# Patient Record
Sex: Female | Born: 1975 | Race: White | Hispanic: No | Marital: Married | Smoking: Former smoker
Health system: Southern US, Community
[De-identification: ages and names within clinical notes are randomized; demographics above are authoritative.]

## PROBLEM LIST (undated history)

## (undated) ENCOUNTER — Inpatient Hospital Stay: Payer: Self-pay

## (undated) DIAGNOSIS — K219 Gastro-esophageal reflux disease without esophagitis: Secondary | ICD-10-CM

## (undated) DIAGNOSIS — J45909 Unspecified asthma, uncomplicated: Secondary | ICD-10-CM

## (undated) DIAGNOSIS — D649 Anemia, unspecified: Secondary | ICD-10-CM

## (undated) DIAGNOSIS — F419 Anxiety disorder, unspecified: Secondary | ICD-10-CM

## (undated) HISTORY — PX: TONSILLECTOMY: SUR1361

## (undated) HISTORY — DX: Unspecified asthma, uncomplicated: J45.909

---

## 2001-10-26 ENCOUNTER — Other Ambulatory Visit: Admission: RE | Admit: 2001-10-26 | Discharge: 2001-10-26 | Payer: Self-pay | Admitting: Family Medicine

## 2004-12-01 ENCOUNTER — Emergency Department: Payer: Self-pay | Admitting: Emergency Medicine

## 2004-12-02 ENCOUNTER — Ambulatory Visit: Payer: Self-pay | Admitting: Emergency Medicine

## 2005-08-14 ENCOUNTER — Ambulatory Visit: Payer: Self-pay | Admitting: Family Medicine

## 2005-08-25 ENCOUNTER — Ambulatory Visit: Payer: Self-pay | Admitting: Family Medicine

## 2011-10-04 ENCOUNTER — Ambulatory Visit: Payer: Self-pay | Admitting: Internal Medicine

## 2012-10-19 ENCOUNTER — Ambulatory Visit: Payer: Self-pay | Admitting: Family Medicine

## 2012-10-19 LAB — PREGNANCY, URINE: Pregnancy Test, Urine: NEGATIVE m[IU]/mL

## 2014-07-16 LAB — OB RESULTS CONSOLE HEPATITIS B SURFACE ANTIGEN: Hepatitis B Surface Ag: NEGATIVE

## 2014-07-16 LAB — OB RESULTS CONSOLE ANTIBODY SCREEN: Antibody Screen: NEGATIVE

## 2014-07-16 LAB — OB RESULTS CONSOLE VARICELLA ZOSTER ANTIBODY, IGG: Varicella: IMMUNE

## 2014-07-16 LAB — OB RESULTS CONSOLE RUBELLA ANTIBODY, IGM: Rubella: IMMUNE

## 2014-07-16 LAB — OB RESULTS CONSOLE ABO/RH: RH TYPE: POSITIVE

## 2014-07-16 LAB — OB RESULTS CONSOLE HIV ANTIBODY (ROUTINE TESTING): HIV: NONREACTIVE

## 2014-07-16 LAB — OB RESULTS CONSOLE GC/CHLAMYDIA
CHLAMYDIA, DNA PROBE: NEGATIVE
Gonorrhea: NEGATIVE

## 2014-07-16 LAB — OB RESULTS CONSOLE HGB/HCT, BLOOD
HCT: 38 %
Hemoglobin: 13.2 g/dL

## 2014-07-16 LAB — OB RESULTS CONSOLE RPR: RPR: NONREACTIVE

## 2014-07-16 LAB — OB RESULTS CONSOLE PLATELET COUNT: Platelets: 289 10*3/uL

## 2014-08-31 NOTE — L&D Delivery Note (Signed)
Delivery Note At  a viable female was delivered via  (Presentation OA: ;  ).  APGAR:8 ,9 ; weight  .   Placenta status: delivered- [place], . Pillow x1 wih 3 vessel Cord:  with the following complications:none .  Cord pH: NA  Anesthesia:  none Episiotomy:  none Lacerations:  2nd degree Suture Repair: 3.0 vicryl rapide Est. Blood Loss (mL):    Mom to postpartum.  Baby to Couplet care / Skin to Skin. Named Darlene Escobar   Darlene Escobar 03/05/2015, 8:55 PM

## 2015-01-30 ENCOUNTER — Encounter: Payer: Self-pay | Admitting: Obstetrics and Gynecology

## 2015-02-01 ENCOUNTER — Telehealth: Payer: Self-pay | Admitting: Obstetrics and Gynecology

## 2015-02-01 NOTE — Telephone Encounter (Signed)
ADVISED PT OK TO TAKE SOME SUDAFED OTC, SHE CAN USE ROBITUSSIN FOR THE COUGH, AND TO USE A HUMIDIFER TO HELP WITH CONGESTION, IF NOT ANY [facility] OFF[facility] FOR  APPT

## 2015-02-05 ENCOUNTER — Encounter: Payer: Self-pay | Admitting: *Deleted

## 2015-02-06 ENCOUNTER — Ambulatory Visit (INDEPENDENT_AMBULATORY_CARE_PROVIDER_SITE_OTHER): Payer: BC Managed Care – PPO | Admitting: Obstetrics and Gynecology

## 2015-02-06 VITALS — BP 120/78 | HR 94 | Wt 226.7 lb

## 2015-02-06 DIAGNOSIS — Z113 Encounter for screening for infections with a predominantly sexual mode of transmission: Secondary | ICD-10-CM

## 2015-02-06 DIAGNOSIS — Z36 Encounter for antenatal screening of mother: Secondary | ICD-10-CM

## 2015-02-06 DIAGNOSIS — Z3483 Encounter for supervision of other normal pregnancy, third trimester: Secondary | ICD-10-CM

## 2015-02-06 DIAGNOSIS — Z3685 Encounter for antenatal screening for Streptococcus B: Secondary | ICD-10-CM

## 2015-02-06 DIAGNOSIS — Z3493 Encounter for supervision of normal pregnancy, unspecified, third trimester: Secondary | ICD-10-CM

## 2015-02-06 LAB — POCT URINALYSIS DIPSTICK
Bilirubin, UA: NEGATIVE
GLUCOSE UA: NEGATIVE
KETONES UA: NEGATIVE
Leukocytes, UA: NEGATIVE
Nitrite, UA: NEGATIVE
RBC UA: NEGATIVE
SPEC GRAV UA: 1.01
Urobilinogen, UA: 0.2
pH, UA: 6

## 2015-02-06 NOTE — Progress Notes (Signed)
ROB- doing well, reports irregular BHC, Cultures obtained, labor precautions discussed; reviewed birth plan- scanned into chart

## 2015-02-06 NOTE — Patient Instructions (Signed)
Place 24-38 weeks prenatal visit patient instructions here.

## 2015-02-06 NOTE — Progress Notes (Signed)
Patient is c/o low back pain, increased pelvic pain

## 2015-02-07 ENCOUNTER — Encounter (HOSPITAL_COMMUNITY): Payer: Self-pay

## 2015-02-08 LAB — STREP GP B NAA: STREP GROUP B AG: POSITIVE — AB

## 2015-02-08 LAB — GC/CHLAMYDIA PROBE AMP
Chlamydia trachomatis, NAA: NEGATIVE
NEISSERIA GONORRHOEAE BY PCR: NEGATIVE

## 2015-02-13 ENCOUNTER — Ambulatory Visit (INDEPENDENT_AMBULATORY_CARE_PROVIDER_SITE_OTHER): Payer: BC Managed Care – PPO | Admitting: Obstetrics and Gynecology

## 2015-02-13 ENCOUNTER — Encounter: Payer: Self-pay | Admitting: Obstetrics and Gynecology

## 2015-02-13 VITALS — BP 110/78 | HR 93 | Wt 229.1 lb

## 2015-02-13 DIAGNOSIS — Z3493 Encounter for supervision of normal pregnancy, unspecified, third trimester: Secondary | ICD-10-CM

## 2015-02-13 LAB — POCT URINALYSIS DIPSTICK
Glucose, UA: NEGATIVE
Ketones, UA: NEGATIVE
LEUKOCYTES UA: NEGATIVE
Nitrite, UA: NEGATIVE
RBC UA: NEGATIVE
Spec Grav, UA: 1.015
UROBILINOGEN UA: 0.2
pH, UA: 6

## 2015-02-13 NOTE — Progress Notes (Signed)
ROB- doing well, reassured about swelling- to increase water intake daily and elevate feet when possible.

## 2015-02-13 NOTE — Progress Notes (Signed)
Pt is c/o contractions, low back pain, swelling in both feet

## 2015-02-14 ENCOUNTER — Observation Stay
Admission: EM | Admit: 2015-02-14 | Discharge: 2015-02-14 | Disposition: A | Discharge status: Home / Self Care | Payer: BC Managed Care – PPO | Attending: Obstetrics and Gynecology | Admitting: Obstetrics and Gynecology

## 2015-02-14 ENCOUNTER — Encounter: Payer: Self-pay | Admitting: *Deleted

## 2015-02-14 DIAGNOSIS — O368190 Decreased fetal movements, unspecified trimester, not applicable or unspecified: Secondary | ICD-10-CM | POA: Diagnosis present

## 2015-02-14 DIAGNOSIS — O368130 Decreased fetal movements, third trimester, not applicable or unspecified: Principal | ICD-10-CM | POA: Insufficient documentation

## 2015-02-14 DIAGNOSIS — O368131 Decreased fetal movements, third trimester, fetus 1: Secondary | ICD-10-CM

## 2015-02-14 NOTE — Discharge Instructions (Signed)
Drink plenty of water and get plenty of rest. Keep your next scheduled appointment, call your provider for any other concerns

## 2015-02-14 NOTE — OB Triage Note (Signed)
Recvd per Wheelchair c/o decreased fetal movement and contractions every 33  minutes since 1013   .  Changed to gown and to bed.  EFM applied.  Oriented to room and EFM applied.

## 2015-02-21 ENCOUNTER — Ambulatory Visit (INDEPENDENT_AMBULATORY_CARE_PROVIDER_SITE_OTHER): Payer: BC Managed Care – PPO | Admitting: Obstetrics and Gynecology

## 2015-02-21 VITALS — BP 110/73 | HR 90 | Wt 230.6 lb

## 2015-02-21 DIAGNOSIS — Z3493 Encounter for supervision of normal pregnancy, unspecified, third trimester: Secondary | ICD-10-CM

## 2015-02-21 LAB — POCT URINALYSIS DIPSTICK
BILIRUBIN UA: NEGATIVE
Blood, UA: NEGATIVE
Glucose, UA: NEGATIVE
KETONES UA: 15
Leukocytes, UA: NEGATIVE
Nitrite, UA: NEGATIVE
PH UA: 6
SPEC GRAV UA: 1.01
Urobilinogen, UA: 0.2

## 2015-02-21 NOTE — Progress Notes (Signed)
ROB- doing well, intermittent pelvic pressure but not new; labor precautions reiterated

## 2015-02-21 NOTE — Progress Notes (Signed)
Pt c/o pelvic pain, increased pressure

## 2015-02-27 ENCOUNTER — Ambulatory Visit (INDEPENDENT_AMBULATORY_CARE_PROVIDER_SITE_OTHER): Payer: BC Managed Care – PPO | Admitting: Obstetrics and Gynecology

## 2015-02-27 ENCOUNTER — Encounter: Payer: Self-pay | Admitting: Obstetrics and Gynecology

## 2015-02-27 VITALS — BP 124/84 | HR 108 | Wt 229.6 lb

## 2015-02-27 DIAGNOSIS — Z3493 Encounter for supervision of normal pregnancy, unspecified, third trimester: Secondary | ICD-10-CM

## 2015-02-27 LAB — POCT URINALYSIS DIPSTICK
Bilirubin, UA: NEGATIVE
KETONES UA: NEGATIVE
Leukocytes, UA: NEGATIVE
Nitrite, UA: NEGATIVE
RBC UA: NEGATIVE
Spec Grav, UA: 1.02
UROBILINOGEN UA: 0.2
pH, UA: 5

## 2015-02-27 NOTE — Progress Notes (Signed)
Pt is having some mild contractions, low back pain,pelvic pressure

## 2015-02-27 NOTE — Progress Notes (Signed)
ROB- doing well, denies any changes since las week; discussed routine postdate care and possible IOL on 7/11 if not delivered by then.

## 2015-03-05 ENCOUNTER — Other Ambulatory Visit: Payer: BC Managed Care – PPO

## 2015-03-05 ENCOUNTER — Inpatient Hospital Stay
Admission: EM | Admit: 2015-03-05 | Discharge: 2015-03-07 | DRG: 775 | Disposition: A | Discharge status: Home / Self Care | Payer: BC Managed Care – PPO | Attending: Obstetrics and Gynecology | Admitting: Obstetrics and Gynecology

## 2015-03-05 ENCOUNTER — Encounter: Payer: BC Managed Care – PPO | Admitting: Obstetrics and Gynecology

## 2015-03-05 DIAGNOSIS — Z79899 Other long term (current) drug therapy: Secondary | ICD-10-CM

## 2015-03-05 DIAGNOSIS — O4292 Full-term premature rupture of membranes, unspecified as to length of time between rupture and onset of labor: Principal | ICD-10-CM | POA: Diagnosis present

## 2015-03-05 DIAGNOSIS — Z3A40 40 weeks gestation of pregnancy: Secondary | ICD-10-CM | POA: Diagnosis present

## 2015-03-05 DIAGNOSIS — O99214 Obesity complicating childbirth: Secondary | ICD-10-CM | POA: Diagnosis present

## 2015-03-05 DIAGNOSIS — O99824 Streptococcus B carrier state complicating childbirth: Secondary | ICD-10-CM | POA: Diagnosis present

## 2015-03-05 DIAGNOSIS — E669 Obesity, unspecified: Secondary | ICD-10-CM | POA: Diagnosis present

## 2015-03-05 DIAGNOSIS — O09513 Supervision of elderly primigravida, third trimester: Secondary | ICD-10-CM

## 2015-03-05 DIAGNOSIS — O480 Post-term pregnancy: Secondary | ICD-10-CM | POA: Diagnosis present

## 2015-03-05 DIAGNOSIS — Z6841 Body Mass Index (BMI) 40.0 and over, adult: Secondary | ICD-10-CM | POA: Diagnosis not present

## 2015-03-05 DIAGNOSIS — Z3403 Encounter for supervision of normal first pregnancy, third trimester: Secondary | ICD-10-CM | POA: Diagnosis not present

## 2015-03-05 LAB — CBC
HEMATOCRIT: 32.9 % — AB (ref 35.0–47.0)
Hemoglobin: 11.6 g/dL — ABNORMAL LOW (ref 12.0–16.0)
MCH: 33.8 pg (ref 26.0–34.0)
MCHC: 35.1 g/dL (ref 32.0–36.0)
MCV: 96.1 fL (ref 80.0–100.0)
PLATELETS: 186 10*3/uL (ref 150–440)
RBC: 3.42 MIL/uL — ABNORMAL LOW (ref 3.80–5.20)
RDW: 13.4 % (ref 11.5–14.5)
WBC: 10.2 10*3/uL (ref 3.6–11.0)

## 2015-03-05 LAB — TYPE AND SCREEN
ABO/RH(D): A POS
ANTIBODY SCREEN: NEGATIVE

## 2015-03-05 LAB — ABO/RH: ABO/RH(D): A POS

## 2015-03-05 MED ORDER — FENTANYL CITRATE (PF) 100 MCG/2ML IJ SOLN
INTRAMUSCULAR | Status: AC
  Administered 2015-03-05: 50 ug via INTRAVENOUS
  Filled 2015-03-05: qty 2

## 2015-03-05 MED ORDER — LIDOCAINE HCL (PF) 1 % IJ SOLN
INTRAMUSCULAR | Status: AC
  Filled 2015-03-05: qty 30

## 2015-03-05 MED ORDER — SODIUM CHLORIDE 0.9 % IV SOLN
INTRAVENOUS | Status: AC
  Administered 2015-03-05: 1 g
  Filled 2015-03-05: qty 1000

## 2015-03-05 MED ORDER — ALBUTEROL SULFATE (2.5 MG/3ML) 0.083% IN NEBU: 3.0000 mL | INHALATION_SOLUTION | Freq: Four times a day (QID) | RESPIRATORY_TRACT | Status: DC | PRN

## 2015-03-05 MED ORDER — WITCH HAZEL-GLYCERIN EX PADS: 1.0000 "application " | MEDICATED_PAD | CUTANEOUS | Status: DC | PRN

## 2015-03-05 MED ORDER — CITRIC ACID-SODIUM CITRATE 334-500 MG/5ML PO SOLN: 30.0000 mL | ORAL | Status: DC | PRN

## 2015-03-05 MED ORDER — AMMONIA AROMATIC IN INHA
RESPIRATORY_TRACT | Status: AC
  Filled 2015-03-05: qty 10

## 2015-03-05 MED ORDER — SODIUM CHLORIDE 0.9 % IV SOLN
INTRAVENOUS | Status: AC
  Administered 2015-03-05: 2 g via INTRAVENOUS
  Filled 2015-03-05: qty 2000

## 2015-03-05 MED ORDER — PRENATAL MULTIVITAMIN CH
1.0000 | ORAL_TABLET | Freq: Every day | ORAL | Status: DC
  Administered 2015-03-06: 1 via ORAL
  Filled 2015-03-05: qty 1

## 2015-03-05 MED ORDER — IBUPROFEN 600 MG PO TABS
600.0000 mg | ORAL_TABLET | Freq: Four times a day (QID) | ORAL | Status: DC
  Administered 2015-03-05 – 2015-03-07 (×6): 600 mg via ORAL
  Filled 2015-03-05 (×6): qty 1

## 2015-03-05 MED ORDER — OXYTOCIN BOLUS FROM INFUSION
500.0000 mL | INTRAVENOUS | Status: DC
  Administered 2015-03-05: 500 mL via INTRAVENOUS

## 2015-03-05 MED ORDER — AMPICILLIN SODIUM 1 G IJ SOLR
INTRAMUSCULAR | Status: AC
  Administered 2015-03-05: 1 g
  Filled 2015-03-05: qty 1000

## 2015-03-05 MED ORDER — MISOPROSTOL 200 MCG PO TABS
ORAL_TABLET | ORAL | Status: AC
  Filled 2015-03-05: qty 4

## 2015-03-05 MED ORDER — ONDANSETRON HCL 4 MG/2ML IJ SOLN: 4.0000 mg | Freq: Four times a day (QID) | INTRAMUSCULAR | Status: DC | PRN

## 2015-03-05 MED ORDER — ACETAMINOPHEN 325 MG PO TABS: 650.0000 mg | ORAL_TABLET | ORAL | Status: DC | PRN

## 2015-03-05 MED ORDER — LACTATED RINGERS IV SOLN
INTRAVENOUS | Status: DC
  Administered 2015-03-05 (×2): via INTRAVENOUS

## 2015-03-05 MED ORDER — SODIUM CHLORIDE 0.9 % IV SOLN
INTRAVENOUS | Status: AC
  Administered 2015-03-05: 20:00:00
  Filled 2015-03-05: qty 1000

## 2015-03-05 MED ORDER — OXYCODONE-ACETAMINOPHEN 5-325 MG PO TABS
1.0000 | ORAL_TABLET | ORAL | Status: DC | PRN
  Administered 2015-03-05: 1 via ORAL

## 2015-03-05 MED ORDER — LIDOCAINE HCL (PF) 1 % IJ SOLN
30.0000 mL | INTRAMUSCULAR | Status: DC | PRN
  Filled 2015-03-05: qty 30

## 2015-03-05 MED ORDER — OXYTOCIN 40 UNITS IN LACTATED RINGERS INFUSION - SIMPLE MED
62.5000 mL/h | INTRAVENOUS | Status: DC
  Administered 2015-03-05: 62.5 mL/h via INTRAVENOUS

## 2015-03-05 MED ORDER — SENNOSIDES-DOCUSATE SODIUM 8.6-50 MG PO TABS
2.0000 | ORAL_TABLET | ORAL | Status: DC
  Administered 2015-03-05 – 2015-03-06 (×2): 2 via ORAL
  Filled 2015-03-05 (×2): qty 2

## 2015-03-05 MED ORDER — LANOLIN HYDROUS EX OINT: TOPICAL_OINTMENT | CUTANEOUS | Status: DC | PRN

## 2015-03-05 MED ORDER — ONDANSETRON HCL 4 MG/2ML IJ SOLN: 4.0000 mg | INTRAMUSCULAR | Status: DC | PRN

## 2015-03-05 MED ORDER — OXYTOCIN 10 UNIT/ML IJ SOLN
INTRAMUSCULAR | Status: AC
  Filled 2015-03-05: qty 2

## 2015-03-05 MED ORDER — TERBUTALINE SULFATE 1 MG/ML IJ SOLN
0.2500 mg | Freq: Once | INTRAMUSCULAR | Status: DC | PRN
  Filled 2015-03-05: qty 1

## 2015-03-05 MED ORDER — OXYCODONE-ACETAMINOPHEN 5-325 MG PO TABS: 1.0000 | ORAL_TABLET | ORAL | Status: DC | PRN

## 2015-03-05 MED ORDER — DIBUCAINE 1 % RE OINT
1.0000 | TOPICAL_OINTMENT | RECTAL | Status: DC | PRN
Start: 2015-03-05 — End: 2015-03-07

## 2015-03-05 MED ORDER — SODIUM CHLORIDE 0.9 % IJ SOLN
INTRAMUSCULAR | Status: AC
  Filled 2015-03-05: qty 50

## 2015-03-05 MED ORDER — LACTATED RINGERS IV SOLN: 500.0000 mL | INTRAVENOUS | Status: DC | PRN

## 2015-03-05 MED ORDER — BENZOCAINE-MENTHOL 20-0.5 % EX AERO: 1.0000 "application " | INHALATION_SPRAY | CUTANEOUS | Status: DC | PRN

## 2015-03-05 MED ORDER — FENTANYL CITRATE (PF) 100 MCG/2ML IJ SOLN
50.0000 ug | INTRAMUSCULAR | Status: DC | PRN
  Administered 2015-03-05 (×6): 50 ug via INTRAVENOUS

## 2015-03-05 MED ORDER — OXYCODONE-ACETAMINOPHEN 5-325 MG PO TABS: 2.0000 | ORAL_TABLET | ORAL | Status: DC | PRN

## 2015-03-05 MED ORDER — OXYTOCIN 40 UNITS IN LACTATED RINGERS INFUSION - SIMPLE MED: 62.5000 mL/h | INTRAVENOUS | Status: DC | PRN

## 2015-03-05 MED ORDER — ONDANSETRON HCL 4 MG PO TABS: 4.0000 mg | ORAL_TABLET | ORAL | Status: DC | PRN

## 2015-03-05 MED ORDER — OXYCODONE-ACETAMINOPHEN 5-325 MG PO TABS
ORAL_TABLET | ORAL | Status: AC
  Administered 2015-03-05: 1 via ORAL
  Filled 2015-03-05: qty 1

## 2015-03-05 MED ORDER — DIPHENHYDRAMINE HCL 25 MG PO CAPS: 25.0000 mg | ORAL_CAPSULE | Freq: Four times a day (QID) | ORAL | Status: DC | PRN

## 2015-03-05 MED ORDER — SODIUM CHLORIDE 0.9 % IV SOLN
2.0000 g | Freq: Once | INTRAVENOUS | Status: AC
  Administered 2015-03-05: 2 g via INTRAVENOUS

## 2015-03-05 MED ORDER — OXYTOCIN 40 UNITS IN LACTATED RINGERS INFUSION - SIMPLE MED
INTRAVENOUS | Status: AC
  Administered 2015-03-05: 1 m[IU]/min via INTRAVENOUS
  Filled 2015-03-05: qty 1000

## 2015-03-05 MED ORDER — SIMETHICONE 80 MG PO CHEW: 80.0000 mg | CHEWABLE_TABLET | ORAL | Status: DC | PRN

## 2015-03-05 MED ORDER — OXYTOCIN 40 UNITS IN LACTATED RINGERS INFUSION - SIMPLE MED
1.0000 m[IU]/min | INTRAVENOUS | Status: DC
  Administered 2015-03-05 (×2): 1 m[IU]/min via INTRAVENOUS

## 2015-03-05 NOTE — Progress Notes (Signed)
Darlene Escobar is a 39 y.o. G1P0 at 6426w4d by LMP admitted for rupture of membranes  Subjective:   Objective: BP 123/85 mmHg  Pulse 97  Temp(Src) 97.9 F (36.6 C) (Oral)  Resp 20  Ht 5\' 3"  (1.6 m)  Wt 103.874 kg (229 lb)  BMI 40.58 kg/m2  LMP 05/25/2014 (LMP Unknown)      FHT:  FHR: 150 bpm, variability: moderate,  accelerations:  Present,  decelerations:  Absent UC:   regular, every 4 minutes on 4 mu/min of pitocin SVE:   Dilation: 1.5 Effacement (%): 90 Station: -2 Exam by:: TFH  Labs: Lab Results  Component Value Date   WBC 10.2 03/05/2015   HGB 11.6* 03/05/2015   HCT 32.9* 03/05/2015   MCV 96.1 03/05/2015   PLT 186 03/05/2015    Assessment / Plan: Augmentation of labor, progressing well  Labor: Progressing on Pitocin, will continue to increase then AROM Preeclampsia:  na Fetal Wellbeing:  Category II Pain Control:  Labor support without medications I/D:  n/a Anticipated MOD:  NSVD  Darlene Escobar 03/05/2015, 12:09 PM

## 2015-03-05 NOTE — OB Triage Note (Signed)
Pt presents to L&D with c/o contractions and leaking fluid at 0100

## 2015-03-05 NOTE — Progress Notes (Signed)
Darlene Escobar is a 39 y.o. G1P0 at 5623w4d by LMP admitted for rupture of membranes  Subjective:   Objective: BP 131/83 mmHg  Pulse 93  Temp(Src) 97.9 F (36.6 C) (Oral)  Resp 20  Ht 5\' 3"  (1.6 m)  Wt 103.874 kg (229 lb)  BMI 40.58 kg/m2  LMP 05/25/2014 (LMP Unknown)      Fetal Wellbeing:  Category II UC:   irregular, every 5-7 minutes SVE: 1.5/80/-2 with scant spotting Labs: Lab Results  Component Value Date   WBC 10.2 03/05/2015   HGB 11.6* 03/05/2015   HCT 32.9* 03/05/2015   MCV 96.1 03/05/2015   PLT 186 03/05/2015    Assessment / Plan: Augmentation of labor, progressing well  Labor: pitocin to be retarted now Preeclampsia:  labs stable Pain Control:  Labor support without medications Anticipated MOD:  NSVD  Darlene Escobar 03/05/2015, 7:55 AM

## 2015-03-05 NOTE — H&P (Signed)
Obstetric History and Physical  Darlene Escobar is a 39 y.o. G1P0 with IUP at 743w4d presenting with leaking of fluid- reports large gush of fluid that soaked the sheets, woke her up at 0100 this am. Patient states she has been having  irregular, every 15 minutes contractions, none vaginal bleeding, intact, clear fluid, NTZ ? With clear mucus like fluid noted membranes, with active fetal movement.    [facility]  Pregnancy complications or risks:obesity  Prenatal labs and studies: ABO, Rh: --/--/A POS (07/05 0445) Antibody: NEG (07/05 0445) Rubella: Immune (11/16 0000) RPR: Nonreactive (11/16 0000)  HBsAg: Negative (11/16 0000)  HIV: Non-reactive (11/16 0000)  GBS:  1 hr Glucola  normal Genetic screening normal Anatomy US normal  History reviewed. No pertinent past medical history.  Past Surgical History  Procedure Laterality Date  . Tonsillectomy      OB History  Gravida Para Term Preterm AB SAB TAB Ectopic Multiple Living  1             # Outcome Date GA Lbr Len/2nd Weight Sex Delivery Anes PTL Lv  1 Current               History   Social History  . Marital Status: Married    Spouse Name: N/A  . Number of Children: N/A  . Years of Education: N/A   Social History Main Topics  . Smoking status: Never Smoker   . Smokeless tobacco: Never Used  . Alcohol Use: No  . Drug Use: No  . Sexual Activity: Yes    Birth Control/ Protection: None   Other Topics Concern  . None   Social History Narrative    Family History  Problem Relation Age of Onset  . Diabetes Mother   . Heart disease Father     Prescriptions prior to admission  Medication Sig Dispense Refill Last Dose  . albuterol (PROVENTIL HFA;VENTOLIN HFA) 108 (90 BASE) MCG/ACT inhaler Inhale 1 puff into the lungs every 6 (six) hours as needed for wheezing or shortness of breath.   Taking  . Prenatal Vit-Fe Fumarate-FA (PRENATAL MULTIVITAMIN) TABS tablet Take 1 tablet by mouth daily  at 12 noon.   Taking    No Known Allergies  Review of Systems: Negative except for what is mentioned in HPI.  Physical Exam: BP 131/88 mmHg  Pulse 98  Temp(Src) 97.9 F (36.6 C) (Oral)  Resp 20  Ht 5\' 3"  (1.6 m)  Wt 103.874 kg (229 lb)  BMI 40.58 kg/m2  LMP 05/25/2014 (LMP Unknown) GENERAL: Well-developed, well-nourished female in no acute distress.  LUNGS: Clear to auscultation bilaterally.  HEART: Regular rate and rhythm. ABDOMEN: Soft, nontender, nondistended, gravid. EXTREMITIES: Nontender, no edema, 2+ distal pulses. Cervical Exam: Dilation: Fingertip Effacement (%): 40 Cervical Position: Posterior Station: -3 Presentation: Undeterminable Exam by:: sca FHT:  Baseline rate 135 bpm   Variability moderate  Accelerations present   Decelerations none Contractions: Every 8 mins   Pertinent Labs/Studies:   Results for orders placed or performed during the hospital encounter of 03/05/15 (from the past 24 hour(s))  CBC     Status: Abnormal   Collection Time: 03/05/15  4:45 AM  Result Value Ref Range   WBC 10.2 3.6 - 11.0 K/uL   RBC 3.42 (L) 3.80 - 5.20 MIL/uL   Hemoglobin 11.6 (L) 12.0 - 16.0 g/dL   HCT 69.632.9 (L) 29.535.0 - 28.447.0 %   MCV 96.1 80.0 - 100.0 fL   MCH 33.8  26.0 - 34.0 pg   MCHC 35.1 32.0 - 36.0 g/dL   RDW 16.1 09.6 - 04.5 %   Platelets 186 150 - 440 K/uL  Type and screen     Status: None   Collection Time: 03/05/15  4:45 AM  Result Value Ref Range   ABO/RH(D) A POS    Antibody Screen NEG    Sample Expiration 03/08/2015     Assessment : Darlene Escobar is a 39 y.o. G1P0 at [redacted]w[redacted]d being admitted for labor, with possible SROM.  Plan: Labor:SROM-  Induction/Augmentation as needed, per protocol FWB: Reassuring fetal heart tracing.  GBS positive Delivery plan: Hopeful for vaginal delivery  Melody Ines Bloomer, CNM [facility], [facility]

## 2015-03-06 LAB — CBC
HCT: 28.9 % — ABNORMAL LOW (ref 35.0–47.0)
Hemoglobin: 9.7 g/dL — ABNORMAL LOW (ref 12.0–16.0)
MCH: 32.4 pg (ref 26.0–34.0)
MCHC: 33.8 g/dL (ref 32.0–36.0)
MCV: 95.9 fL (ref 80.0–100.0)
Platelets: 183 10*3/uL (ref 150–440)
RBC: 3.01 MIL/uL — ABNORMAL LOW (ref 3.80–5.20)
RDW: 13.7 % (ref 11.5–14.5)
WBC: 19.4 10*3/uL — ABNORMAL HIGH (ref 3.6–11.0)

## 2015-03-06 LAB — RPR: RPR Ser Ql: NONREACTIVE

## 2015-03-06 NOTE — Progress Notes (Signed)
Post Partum Day 1 Subjective: no complaints, up ad lib and voiding  Objective: Blood pressure 109/59, pulse 88, temperature 98.9 F (37.2 C), temperature source Oral, resp. rate 20, height 5\' 3"  (1.6 m), weight 103.874 kg (229 lb), last menstrual period 05/25/2014, SpO2 98 %, unknown if currently breastfeeding.  Physical Exam:  General: alert and cooperative Lochia: appropriate Uterine Fundus: firm Incision: NA DVT Evaluation: No evidence of DVT seen on physical exam. Negative Homan's sign.   Recent Labs  03/05/15 0445 03/06/15 0604  HGB 11.6* 9.7*  HCT 32.9* 28.9*    Assessment/Plan: Plan for discharge tomorrow and Breastfeeding   LOS: 1 day   Rashied Corallo Burr 03/06/2015, 3:52 PM

## 2015-03-07 MED ORDER — VITAMIN D 50 MCG (2000 UT) PO CAPS: 1.0000 | ORAL_CAPSULE | ORAL | Status: DC

## 2015-03-07 NOTE — Discharge Instructions (Signed)
Please call your doctor or return to the ER if you experience any chest pains, shortness of breath, fever greater than 101, any heavy bleeding or large clots, and foul smelling vaginal discharge, any worsening abdominal pain & cramping that is not controlled by pain medication, or any signs of post partum depression.  No tampons, enemas, douches, or sexual intercourse for 6 weeks.  Also avoid tub baths, hot tubs, or swimming for 6 weeks.    

## 2015-03-07 NOTE — Progress Notes (Signed)
D/C order from MD.  Reviewed d/c instructions and prescriptions with patient and answered any questions.  Patient d/c home with infant via wheelchair by nursing/auxillary. 

## 2015-03-07 NOTE — Discharge Summary (Signed)
Obstetric Discharge Summary Reason for Admission: onset of labor Prenatal Procedures: ultrasound Intrapartum Procedures: spontaneous vaginal delivery Postpartum Procedures: none Complications-Operative and Postpartum: 2nd degree perineal laceration HEMOGLOBIN  Date Value Ref Range Status  03/06/2015 9.7* 12.0 - 16.0 g/dL Final  96/04/540911/16/2015 81.113.2 g/dL Final   HCT  Date Value Ref Range Status  03/06/2015 28.9* 35.0 - 47.0 % Final  07/16/2014 38 % Final    Physical Exam:  General: alert, cooperative, fatigued and pale Lochia: appropriate Uterine Fundus: firm Incision: NA DVT Evaluation: No evidence of DVT seen on physical exam. Negative Homan's sign.  Discharge Diagnoses: Term Pregnancy-delivered  Discharge Information: Date: 03/07/2015 Activity: pelvic rest Diet: routine Medications: PNV, Ibuprofen, Colace and Iron Condition: stable Instructions: refer to practice specific booklet Discharge to: home   Newborn Data: Live born female  Birth Weight: 8 lb 8.5 oz (3870 g) APGAR: 8, 9  Home with mother, plans circumcision post discharge.  Melody Burr 03/07/2015, 8:22 AM

## 2015-03-08 ENCOUNTER — Other Ambulatory Visit: Payer: BC Managed Care – PPO

## 2015-04-16 ENCOUNTER — Ambulatory Visit (INDEPENDENT_AMBULATORY_CARE_PROVIDER_SITE_OTHER): Payer: BC Managed Care – PPO | Admitting: Obstetrics and Gynecology

## 2015-04-16 ENCOUNTER — Encounter: Payer: Self-pay | Admitting: Obstetrics and Gynecology

## 2015-04-16 DIAGNOSIS — L259 Unspecified contact dermatitis, unspecified cause: Secondary | ICD-10-CM

## 2015-04-16 MED ORDER — CLOBETASOL PROPIONATE 0.05 % EX OINT: 1.0000 "application " | TOPICAL_OINTMENT | Freq: Two times a day (BID) | CUTANEOUS | Status: DC

## 2015-04-16 NOTE — Patient Instructions (Signed)
  Place postpartum visit patient instructions here.  

## 2015-04-16 NOTE — Progress Notes (Signed)
  Subjective:     Darlene Escobar is a 39 y.o. female who presents for a postpartum visit. She is 6 weeks postpartum following a spontaneous vaginal delivery. I have fully reviewed the prenatal and intrapartum course. The delivery was at 39 gestational weeks. Outcome: spontaneous vaginal delivery. Anesthesia: epidural. Postpartum course has been uneventful. Baby's course has been uneventful. Baby is feeding by breast. Bleeding thin lochia. Bowel function is normal. Bladder function is normal. Patient is not sexually active. Contraception method is abstinence. Postpartum depression screening: negative.  The following portions of the patient's history were reviewed and updated as appropriate: allergies, current medications, past family history, past medical history, past social history, past surgical history and problem list.  Review of Systems A comprehensive review of systems was negative except for: skin rash x 1 week in groin bilaterally and on gluteal cheeks   Objective:    BP 117/82 mmHg  Pulse 81  Ht  (1.6 m)  Wt 208 lb 8 oz (94.575 kg)  BMI 36.94 kg/m2  LMP 04/14/2015  Breastfeeding? Yes  General:  alert, cooperative, appears stated age and mildly obese   Breasts:  inspection negative, no nipple discharge or bleeding, no masses or nodularity palpable  Lungs: clear to auscultation bilaterally  Heart:  regular rate and rhythm, S1, S2 normal, no murmur, click, rub or gallop  Abdomen: soft, non-tender; bowel sounds normal; no masses,  no organomegaly   Vulva:  normal  Vagina: normal vagina  Cervix:  multiparous appearance and dark red rubra  Corpus: normal size, contour, position, consistency, mobility, non-tender  Adnexa:  normal adnexa  Rectal Exam: Not performed.       Groin with contact dermatitis- also noted on both cheeks Assessment:     6 weeks postpartum exam. Pap smear not done at today's visit.   Plan:    1. Contraception: condoms 2. Contact dermatitis- rx for  clobetasol sent in 3. Follow up in: 4 months or as needed.

## 2015-05-01 ENCOUNTER — Other Ambulatory Visit: Payer: Self-pay | Admitting: Obstetrics and Gynecology

## 2015-05-01 ENCOUNTER — Telehealth: Payer: Self-pay | Admitting: Obstetrics and Gynecology

## 2015-05-01 DIAGNOSIS — R21 Rash and other nonspecific skin eruption: Secondary | ICD-10-CM

## 2015-05-01 NOTE — Telephone Encounter (Signed)
Please let her know I am not sure what else to prescrib, and see if she has had appt with dermatologist as I suggested? If not I want her to see the dermatologist for this, and can take benedryl prn until then.

## 2015-05-01 NOTE — Telephone Encounter (Signed)
Please let her know when referral sent in

## 2015-05-01 NOTE — Telephone Encounter (Signed)
Melody please put referral in for derm, pt voiced understanding

## 2015-05-01 NOTE — Telephone Encounter (Signed)
Darlene Escobar came in a couple wks ago and you gave her something for a rash. It is worse no and asked could she get something different for it. Very itchy and just worse.

## 2015-08-16 ENCOUNTER — Other Ambulatory Visit: Payer: Self-pay | Admitting: Obstetrics and Gynecology

## 2015-08-16 ENCOUNTER — Ambulatory Visit (INDEPENDENT_AMBULATORY_CARE_PROVIDER_SITE_OTHER): Payer: BC Managed Care – PPO | Admitting: Obstetrics and Gynecology

## 2015-08-16 ENCOUNTER — Encounter: Payer: Self-pay | Admitting: Obstetrics and Gynecology

## 2015-08-16 VITALS — BP 107/73 | HR 83 | Ht 63.0 in | Wt 215.5 lb

## 2015-08-16 DIAGNOSIS — Z01419 Encounter for gynecological examination (general) (routine) without abnormal findings: Secondary | ICD-10-CM

## 2015-08-16 NOTE — Progress Notes (Signed)
Subjective:   Darlene Escobar is a 39 y.o. 411P1001 Caucasian female here for a routine well-woman exam.  No LMP recorded.    Current complaints: right nipple tenderness x 3 days after infant bite down on it. PCP: none       Doesn't need or desire labs  Social History: Sexual: heterosexual Marital Status: married Living situation: with spouse Occupation: homemaker Tobacco/alcohol: no tobacco use Illicit drugs: no history of illicit drug use  The following portions of the patient's history were reviewed and updated as appropriate: allergies, current medications, past family history, past medical history, past social history, past surgical history and problem list.  Past Medical History History reviewed. No pertinent past medical history.  Past Surgical History Past Surgical History  Procedure Laterality Date  . Tonsillectomy      Gynecologic History G1P1001  No LMP recorded. Contraception: coitus interruptus Last Pap: 2014. Results were: normal  Obstetric History OB History  Gravida Para Term Preterm AB SAB TAB Ectopic Multiple Living  1 1 1       0 1    # Outcome Date GA Lbr Len/2nd Weight Sex Delivery Anes PTL Lv  1 Term 03/05/15 6560w4d / 00:17 8 lb 8.5 oz (3.87 kg) M Vag-Spont None  Y      Current Medications Current Outpatient Prescriptions on File Prior to Visit  Medication Sig Dispense Refill  . albuterol (PROVENTIL HFA;VENTOLIN HFA) 108 (90 BASE) MCG/ACT inhaler Inhale 1 puff into the lungs every 6 (six) hours as needed for wheezing or shortness of breath.    . Cholecalciferol (VITAMIN D) 2000 UNITS CAPS Take 1 capsule (2,000 Units total) by mouth as directed. 90 capsule 2  . Prenatal Vit-Fe Fumarate-FA (PRENATAL MULTIVITAMIN) TABS tablet Take 1 tablet by mouth daily.     . clobetasol ointment (TEMOVATE) 0.05 % Apply 1 application topically 2 (two) times daily. Apply to affected area (Patient not taking: Reported on 08/16/2015) 30 g 2   No current  facility-administered medications on file prior to visit.    Review of Systems Patient denies any headaches, blurred vision, shortness of breath, chest pain, abdominal pain, problems with bowel movements, urination, or intercourse.  Objective:  BP 107/73 mmHg  Pulse 83  Ht 5\' 3"  (1.6 m)  Wt 215 lb 8 oz (97.75 kg)  BMI 38.18 kg/m2  Breastfeeding? No Physical Exam  General:  Well developed, well nourished, no acute distress. She is alert and oriented x3. Skin:  Warm and dry Neck:  Midline trachea, no thyromegaly or nodules Cardiovascular: Regular rate and rhythm, no murmur heard Lungs:  Effort normal, all lung fields clear to auscultation bilaterally Breasts:  No dominant palpable mass, retraction, or nipple discharge Abdomen:  Soft, non tender, no hepatosplenomegaly or masses Pelvic:  External genitalia is normal in appearance.  The vagina is normal in appearance. The cervix is bulbous, no CMT.  Thin prep pap is done with HR HPV cotesting. Uterus is felt to be normal size, shape, and contour.  No adnexal masses or tenderness noted. Extremities:  No swelling or varicosities noted Psych:  She has a normal mood and affect  Assessment:   Healthy well-woman exam Obesity Breast feeding  Plan:  Pap obtained F/U 1 year for AE, or sooner if needed  Melody Suzan NailerN Shambley, CNM

## 2015-08-16 NOTE — Patient Instructions (Signed)
  Place annual gynecologic exam patient instructions here.  Thank you for enrolling in MyChart. Please follow the instructions below to securely access your online medical record. MyChart allows you to send messages to your doctor, view your test results, manage appointments, and more.   How Do I Sign Up? 1. In your Internet browser, go to [employer] and enter [email or web address]. 2. Click on the Sign Up Now link in the Sign In box. You will see the New Member Sign Up page. 3. Enter your MyChart Access Code exactly as it appears below. You will not need to use this code after you've completed the sign-up process. If you do not sign up before the expiration date, you must request a new code.  MyChart Access Code: [email or web address] Expires: 10/15/2015  9:46 AM  4. Enter your Social Security Number ([id number]) and Date of Birth (mm/dd/yyyy) as indicated and click Submit. You will be taken to the next sign-up page. 5. Create a MyChart ID. This will be your MyChart login ID and cannot be changed, so think of one that is secure and easy to remember. 6. Create a MyChart password. You can change your password at any time. 7. Enter your Password Reset Question and Answer. This can be used at a later time if you forget your password.  8. Enter your e-mail address. You will receive e-mail notification when new information is available in MyChart. 9. Click Sign Up. You can now view your medical record.   Additional Information Remember, MyChart is NOT to be used for urgent needs. For medical emergencies, dial 911.

## 2015-08-20 LAB — CYTOLOGY - PAP

## 2015-08-21 ENCOUNTER — Other Ambulatory Visit: Payer: Self-pay | Admitting: Obstetrics and Gynecology

## 2015-08-21 DIAGNOSIS — IMO0002 Reserved for concepts with insufficient information to code with codable children: Secondary | ICD-10-CM | POA: Insufficient documentation

## 2015-08-30 ENCOUNTER — Encounter: Payer: Self-pay | Admitting: Emergency Medicine

## 2015-08-30 ENCOUNTER — Ambulatory Visit (INDEPENDENT_AMBULATORY_CARE_PROVIDER_SITE_OTHER): Payer: BC Managed Care – PPO

## 2015-08-30 ENCOUNTER — Ambulatory Visit
Admission: EM | Admit: 2015-08-30 | Discharge: 2015-08-30 | Disposition: A | Discharge status: Home / Self Care | Payer: BC Managed Care – PPO | Attending: Family Medicine | Admitting: Family Medicine

## 2015-08-30 DIAGNOSIS — S161XXA Strain of muscle, fascia and tendon at neck level, initial encounter: Secondary | ICD-10-CM

## 2015-08-30 DIAGNOSIS — S29009A Unspecified injury of muscle and tendon of unspecified wall of thorax, initial encounter: Secondary | ICD-10-CM | POA: Diagnosis not present

## 2015-08-30 DIAGNOSIS — S29019A Strain of muscle and tendon of unspecified wall of thorax, initial encounter: Secondary | ICD-10-CM

## 2015-08-30 LAB — PREGNANCY, URINE: PREG TEST UR: NEGATIVE

## 2015-08-30 MED ORDER — IBUPROFEN 800 MG PO TABS: 800.0000 mg | ORAL_TABLET | Freq: Three times a day (TID) | ORAL | Status: AC | PRN

## 2015-08-30 MED ORDER — KETOROLAC TROMETHAMINE 10 MG PO TABS: 10.0000 mg | ORAL_TABLET | Freq: Four times a day (QID) | ORAL | Status: DC | PRN

## 2015-08-30 MED ORDER — KETOROLAC TROMETHAMINE 10 MG PO TABS: 10.0000 mg | ORAL_TABLET | Freq: Four times a day (QID) | ORAL | Status: AC | PRN

## 2015-08-30 MED ORDER — ACETAMINOPHEN 500 MG PO TABS: 1000.0000 mg | ORAL_TABLET | Freq: Four times a day (QID) | ORAL | Status: AC | PRN
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
## 2015-08-30 NOTE — Discharge Instructions (Signed)
°Cervical Strain and Sprain With Rehab °Cervical strain and sprain are injuries that commonly occur with "whiplash" injuries. Whiplash occurs when the neck is forcefully whipped backward or forward, such as during a motor vehicle accident or during contact sports. The muscles, ligaments, tendons, discs, and nerves of the neck are susceptible to injury when this occurs. °RISK FACTORS °Risk of having a whiplash injury increases if: °· Osteoarthritis of the spine. °· Situations that make head or neck accidents or trauma more likely. °· High-risk sports (football, rugby, wrestling, hockey, auto racing, gymnastics, diving, contact karate, or boxing). °· Poor strength and flexibility of the neck. °· Previous neck injury. °· Poor tackling technique. °· Improperly fitted or padded equipment. °SYMPTOMS  °· Pain or stiffness in the front or back of neck or both. °· Symptoms may present immediately or up to 24 hours after injury. °· Dizziness, headache, nausea, and vomiting. °· Muscle spasm with soreness and stiffness in the neck. °· Tenderness and swelling at the injury site. °PREVENTION °· Learn and use proper technique (avoid tackling with the head, spearing, and head-butting; use proper falling techniques to avoid landing on the head). °· Warm up and stretch properly before activity. °· Maintain physical fitness: °¨ Strength, flexibility, and endurance. °¨ Cardiovascular fitness. °· Wear properly fitted and padded protective equipment, such as padded soft collars, for participation in contact sports. °PROGNOSIS  °Recovery from cervical strain and sprain injuries is dependent on the extent of the injury. These injuries are usually curable in 1 week to 3 months with appropriate treatment.  °RELATED COMPLICATIONS  °· Temporary numbness and weakness may occur if the nerve roots are damaged, and this may persist until the nerve has completely healed. °· Chronic pain due to frequent recurrence of symptoms. °· Prolonged healing,  especially if activity is resumed too soon (before complete recovery). °TREATMENT  °Treatment initially involves the use of ice and medication to help reduce pain and inflammation. It is also important to perform strengthening and stretching exercises and modify activities that worsen symptoms so the injury does not get worse. These exercises may be performed at home or with a therapist. For patients who experience severe symptoms, a soft, padded collar may be recommended to be worn around the neck.  °Improving your posture may help reduce symptoms. Posture improvement includes pulling your chin and abdomen in while sitting or standing. If you are sitting, sit in a firm chair with your buttocks against the back of the chair. While sleeping, try replacing your pillow with a small towel rolled to 2 inches in diameter, or use a cervical pillow or soft cervical collar. Poor sleeping positions delay healing.  °For patients with nerve root damage, which causes numbness or weakness, the use of a cervical traction apparatus may be recommended. Surgery is rarely necessary for these injuries. However, cervical strain and sprains that are present at birth (congenital) may require surgery. °MEDICATION  °· If pain medication is necessary, nonsteroidal anti-inflammatory medications, such as aspirin and ibuprofen, or other minor pain relievers, such as acetaminophen, are often recommended. °· Do not take pain medication for 7 days before surgery. °· Prescription pain relievers may be given if deemed necessary by your caregiver. Use only as directed and only as much as you need. °HEAT AND COLD:  °· Cold treatment (icing) relieves pain and reduces inflammation. Cold treatment should be applied for 10 to 15 minutes every 2 to 3 hours for inflammation and pain and immediately after any activity that aggravates your   symptoms. Use ice packs or an ice massage.  Heat treatment may be used prior to performing the stretching and  strengthening activities prescribed by your caregiver, physical therapist, or athletic trainer. Use a heat pack or a warm soak. SEEK MEDICAL CARE IF:  1. Symptoms get worse or do not improve in 2 weeks despite treatment. 2. New, unexplained symptoms develop (drugs used in treatment may produce side effects). EXERCISES RANGE OF MOTION (ROM) AND STRETCHING EXERCISES - Cervical Strain and Sprain These exercises may help you when beginning to rehabilitate your injury. In order to successfully resolve your symptoms, you must improve your posture. These exercises are designed to help reduce the forward-head and rounded-shoulder posture which contributes to this condition. Your symptoms may resolve with or without further involvement from your physician, physical therapist or athletic trainer. While completing these exercises, remember:   Restoring tissue flexibility helps normal motion to return to the joints. This allows healthier, less painful movement and activity.  An effective stretch should be held for at least 20 seconds, although you may need to begin with shorter hold times for comfort.  A stretch should never be painful. You should only feel a gentle lengthening or release in the stretched tissue. STRETCH- Axial Extensors  Lie on your back on the floor. You may bend your knees for comfort. Place a rolled-up hand towel or dish towel, about 2 inches in diameter, under the part of your head that makes contact with the floor.  Gently tuck your chin, as if trying to make a "double chin," until you feel a gentle stretch at the base of your head.  Hold __________ seconds. Repeat __________ times. Complete this exercise __________ times per day.  STRETCH - Axial Extension   Stand or sit on a firm surface. Assume a good posture: chest up, shoulders drawn back, abdominal muscles slightly tense, knees unlocked (if standing) and feet hip width apart.  Slowly retract your chin so your head slides  back and your chin slightly lowers. Continue to look straight ahead.  You should feel a gentle stretch in the back of your head. Be certain not to feel an aggressive stretch since this can cause headaches later.  Hold for __________ seconds. Repeat __________ times. Complete this exercise __________ times per day. STRETCH - Cervical Side Bend   Stand or sit on a firm surface. Assume a good posture: chest up, shoulders drawn back, abdominal muscles slightly tense, knees unlocked (if standing) and feet hip width apart.  Without letting your nose or shoulders move, slowly tip your right / left ear to your shoulder until your feel a gentle stretch in the muscles on the opposite side of your neck.  Hold __________ seconds. Repeat __________ times. Complete this exercise __________ times per day. STRETCH - Cervical Rotators   Stand or sit on a firm surface. Assume a good posture: chest up, shoulders drawn back, abdominal muscles slightly tense, knees unlocked (if standing) and feet hip width apart.  Keeping your eyes level with the ground, slowly turn your head until you feel a gentle stretch along the back and opposite side of your neck.  Hold __________ seconds. Repeat __________ times. Complete this exercise __________ times per day. RANGE OF MOTION - Neck Circles   Stand or sit on a firm surface. Assume a good posture: chest up, shoulders drawn back, abdominal muscles slightly tense, knees unlocked (if standing) and feet hip width apart.  Gently roll your head down and around from the back  of one shoulder to the back of the other. The motion should never be forced or painful.  Repeat the motion 10-20 times, or until you feel the neck muscles relax and loosen. Repeat __________ times. Complete the exercise __________ times per day. STRENGTHENING EXERCISES - Cervical Strain and Sprain These exercises may help you when beginning to rehabilitate your injury. They may resolve your symptoms  with or without further involvement from your physician, physical therapist, or athletic trainer. While completing these exercises, remember:   Muscles can gain both the endurance and the strength needed for everyday activities through controlled exercises.  Complete these exercises as instructed by your physician, physical therapist, or athletic trainer. Progress the resistance and repetitions only as guided.  You may experience muscle soreness or fatigue, but the pain or discomfort you are trying to eliminate should never worsen during these exercises. If this pain does worsen, stop and make certain you are following the directions exactly. If the pain is still present after adjustments, discontinue the exercise until you can discuss the trouble with your clinician. STRENGTH - Cervical Flexors, Isometric  Face a wall, standing about 6 inches away. Place a small pillow, a ball about 6-8 inches in diameter, or a folded towel between your forehead and the wall.  Slightly tuck your chin and gently push your forehead into the soft object. Push only with mild to moderate intensity, building up tension gradually. Keep your jaw and forehead relaxed.  Hold 10 to 20 seconds. Keep your breathing relaxed.  Release the tension slowly. Relax your neck muscles completely before you start the next repetition. Repeat __________ times. Complete this exercise __________ times per day. STRENGTH- Cervical Lateral Flexors, Isometric   Stand about 6 inches away from a wall. Place a small pillow, a ball about 6-8 inches in diameter, or a folded towel between the side of your head and the wall.  Slightly tuck your chin and gently tilt your head into the soft object. Push only with mild to moderate intensity, building up tension gradually. Keep your jaw and forehead relaxed.  Hold 10 to 20 seconds. Keep your breathing relaxed.  Release the tension slowly. Relax your neck muscles completely before you start the next  repetition. Repeat __________ times. Complete this exercise __________ times per day. STRENGTH - Cervical Extensors, Isometric   Stand about 6 inches away from a wall. Place a small pillow, a ball about 6-8 inches in diameter, or a folded towel between the back of your head and the wall.  Slightly tuck your chin and gently tilt your head back into the soft object. Push only with mild to moderate intensity, building up tension gradually. Keep your jaw and forehead relaxed.  Hold 10 to 20 seconds. Keep your breathing relaxed.  Release the tension slowly. Relax your neck muscles completely before you start the next repetition. Repeat __________ times. Complete this exercise __________ times per day. POSTURE AND BODY MECHANICS CONSIDERATIONS - Cervical Strain and Sprain Keeping correct posture when sitting, standing or completing your activities will reduce the stress put on different body tissues, allowing injured tissues a chance to heal and limiting painful experiences. The following are general guidelines for improved posture. Your physician or physical therapist will provide you with any instructions specific to your needs. While reading these guidelines, remember:  The exercises prescribed by your provider will help you have the flexibility and strength to maintain correct postures.  The correct posture provides the optimal environment for your joints to work.  All of your joints have less wear and tear when properly supported by a spine with good posture. This means you will experience a healthier, less painful body.  Correct posture must be practiced with all of your activities, especially prolonged sitting and standing. Correct posture is as important when doing repetitive low-stress activities (typing) as it is when doing a single heavy-load activity (lifting). PROLONGED STANDING WHILE SLIGHTLY LEANING FORWARD When completing a task that requires you to lean forward while standing in one  place for a long time, place either foot up on a stationary 2- to 4-inch high object to help maintain the best posture. When both feet are on the ground, the low back tends to lose its slight inward curve. If this curve flattens (or becomes too large), then the back and your other joints will experience too much stress, fatigue more quickly, and can cause pain.  RESTING POSITIONS Consider which positions are most painful for you when choosing a resting position. If you have pain with flexion-based activities (sitting, bending, stooping, squatting), choose a position that allows you to rest in a less flexed posture. You would want to avoid curling into a fetal position on your side. If your pain worsens with extension-based activities (prolonged standing, working overhead), avoid resting in an extended position such as sleeping on your stomach. Most people will find more comfort when they rest with their spine in a more neutral position, neither too rounded nor too arched. Lying on a non-sagging bed on your side with a pillow between your knees, or on your back with a pillow under your knees will often provide some relief. Keep in mind, being in any one position for a prolonged period of time, no matter how correct your posture, can still lead to stiffness. WALKING Walk with an upright posture. Your ears, shoulders, and hips should all line up. OFFICE WORK When working at a desk, create an environment that supports good, upright posture. Without extra support, muscles fatigue and lead to excessive strain on joints and other tissues. CHAIR:  A chair should be able to slide under your desk when your back makes contact with the back of the chair. This allows you to work closely.  The chair's height should allow your eyes to be level with the upper part of your monitor and your hands to be slightly lower than your elbows.  Body position:  Your feet should make contact with the floor. If this is not  possible, use a foot rest.  Keep your ears over your shoulders. This will reduce stress on your neck and low back.   This information is not intended to replace advice given to you by your health care provider. Make sure you discuss any questions you have with your health care provider.   Document Released: 08/17/2005 Document Revised: 09/07/2014 Document Reviewed: 11/29/2008 [employer] Interactive Patient Education 2016 [employer]  Cervical Strain and Sprain With Rehab Cervical strain and sprain are injuries that commonly occur with "whiplash" injuries. Whiplash occurs when the neck is forcefully whipped backward or forward, such as during a motor vehicle accident or during contact sports. The muscles, ligaments, tendons, discs, and nerves of the neck are susceptible to injury when this occurs. RISK FACTORS Risk of having a whiplash injury increases if:  Osteoarthritis of the spine.  Situations that make head or neck accidents or trauma more likely.  High-risk sports (football, rugby, wrestling, hockey, auto racing, gymnastics, diving, contact karate, or boxing).  Poor strength and  flexibility of the neck.  Previous neck injury.  Poor tackling technique.  Improperly fitted or padded equipment. SYMPTOMS   Pain or stiffness in the front or back of neck or both.  Symptoms may present immediately or up to 24 hours after injury.  Dizziness, headache, nausea, and vomiting.  Muscle spasm with soreness and stiffness in the neck.  Tenderness and swelling at the injury site. PREVENTION  Learn and use proper technique (avoid tackling with the head, spearing, and head-butting; use proper falling techniques to avoid landing on the head).  Warm up and stretch properly before activity.  Maintain physical fitness:  Strength, flexibility, and endurance.  Cardiovascular fitness.  Wear properly fitted and padded protective equipment, such as padded soft collars, for participation in  contact sports. PROGNOSIS  Recovery from cervical strain and sprain injuries is dependent on the extent of the injury. These injuries are usually curable in 1 week to 3 months with appropriate treatment.  RELATED COMPLICATIONS   Temporary numbness and weakness may occur if the nerve roots are damaged, and this may persist until the nerve has completely healed.  Chronic pain due to frequent recurrence of symptoms.  Prolonged healing, especially if activity is resumed too soon (before complete recovery). TREATMENT  Treatment initially involves the use of ice and medication to help reduce pain and inflammation. It is also important to perform strengthening and stretching exercises and modify activities that worsen symptoms so the injury does not get worse. These exercises may be performed at home or with a therapist. For patients who experience severe symptoms, a soft, padded collar may be recommended to be worn around the neck.  Improving your posture may help reduce symptoms. Posture improvement includes pulling your chin and abdomen in while sitting or standing. If you are sitting, sit in a firm chair with your buttocks against the back of the chair. While sleeping, try replacing your pillow with a small towel rolled to 2 inches in diameter, or use a cervical pillow or soft cervical collar. Poor sleeping positions delay healing.  For patients with nerve root damage, which causes numbness or weakness, the use of a cervical traction apparatus may be recommended. Surgery is rarely necessary for these injuries. However, cervical strain and sprains that are present at birth (congenital) may require surgery. MEDICATION   If pain medication is necessary, nonsteroidal anti-inflammatory medications, such as aspirin and ibuprofen, or other minor pain relievers, such as acetaminophen, are often recommended.  Do not take pain medication for 7 days before surgery.  Prescription pain relievers may be given if  deemed necessary by your caregiver. Use only as directed and only as much as you need. HEAT AND COLD:   Cold treatment (icing) relieves pain and reduces inflammation. Cold treatment should be applied for 10 to 15 minutes every 2 to 3 hours for inflammation and pain and immediately after any activity that aggravates your symptoms. Use ice packs or an ice massage.  Heat treatment may be used prior to performing the stretching and strengthening activities prescribed by your caregiver, physical therapist, or athletic trainer. Use a heat pack or a warm soak. SEEK MEDICAL CARE IF:  3. Symptoms get worse or do not improve in 2 weeks despite treatment. 4. New, unexplained symptoms develop (drugs used in treatment may produce side effects). EXERCISES RANGE OF MOTION (ROM) AND STRETCHING EXERCISES - Cervical Strain and Sprain These exercises may help you when beginning to rehabilitate your injury. In order to successfully resolve your symptoms, you  must improve your posture. These exercises are designed to help reduce the forward-head and rounded-shoulder posture which contributes to this condition. Your symptoms may resolve with or without further involvement from your physician, physical therapist or athletic trainer. While completing these exercises, remember:   Restoring tissue flexibility helps normal motion to return to the joints. This allows healthier, less painful movement and activity.  An effective stretch should be held for at least 20 seconds, although you may need to begin with shorter hold times for comfort.  A stretch should never be painful. You should only feel a gentle lengthening or release in the stretched tissue. STRETCH- Axial Extensors  Lie on your back on the floor. You may bend your knees for comfort. Place a rolled-up hand towel or dish towel, about 2 inches in diameter, under the part of your head that makes contact with the floor.  Gently tuck your chin, as if trying to make  a "double chin," until you feel a gentle stretch at the base of your head.  Hold __________ seconds. Repeat __________ times. Complete this exercise __________ times per day.  STRETCH - Axial Extension   Stand or sit on a firm surface. Assume a good posture: chest up, shoulders drawn back, abdominal muscles slightly tense, knees unlocked (if standing) and feet hip width apart.  Slowly retract your chin so your head slides back and your chin slightly lowers. Continue to look straight ahead.  You should feel a gentle stretch in the back of your head. Be certain not to feel an aggressive stretch since this can cause headaches later.  Hold for __________ seconds. Repeat __________ times. Complete this exercise __________ times per day. STRETCH - Cervical Side Bend   Stand or sit on a firm surface. Assume a good posture: chest up, shoulders drawn back, abdominal muscles slightly tense, knees unlocked (if standing) and feet hip width apart.  Without letting your nose or shoulders move, slowly tip your right / left ear to your shoulder until your feel a gentle stretch in the muscles on the opposite side of your neck.  Hold __________ seconds. Repeat __________ times. Complete this exercise __________ times per day. STRETCH - Cervical Rotators   Stand or sit on a firm surface. Assume a good posture: chest up, shoulders drawn back, abdominal muscles slightly tense, knees unlocked (if standing) and feet hip width apart.  Keeping your eyes level with the ground, slowly turn your head until you feel a gentle stretch along the back and opposite side of your neck.  Hold __________ seconds. Repeat __________ times. Complete this exercise __________ times per day. RANGE OF MOTION - Neck Circles   Stand or sit on a firm surface. Assume a good posture: chest up, shoulders drawn back, abdominal muscles slightly tense, knees unlocked (if standing) and feet hip width apart.  Gently roll your head down  and around from the back of one shoulder to the back of the other. The motion should never be forced or painful.  Repeat the motion 10-20 times, or until you feel the neck muscles relax and loosen. Repeat __________ times. Complete the exercise __________ times per day. STRENGTHENING EXERCISES - Cervical Strain and Sprain These exercises may help you when beginning to rehabilitate your injury. They may resolve your symptoms with or without further involvement from your physician, physical therapist, or athletic trainer. While completing these exercises, remember:   Muscles can gain both the endurance and the strength needed for everyday activities through controlled exercises.  Complete these exercises as instructed by your physician, physical therapist, or athletic trainer. Progress the resistance and repetitions only as guided.  You may experience muscle soreness or fatigue, but the pain or discomfort you are trying to eliminate should never worsen during these exercises. If this pain does worsen, stop and make certain you are following the directions exactly. If the pain is still present after adjustments, discontinue the exercise until you can discuss the trouble with your clinician. STRENGTH - Cervical Flexors, Isometric  Face a wall, standing about 6 inches away. Place a small pillow, a ball about 6-8 inches in diameter, or a folded towel between your forehead and the wall.  Slightly tuck your chin and gently push your forehead into the soft object. Push only with mild to moderate intensity, building up tension gradually. Keep your jaw and forehead relaxed.  Hold 10 to 20 seconds. Keep your breathing relaxed.  Release the tension slowly. Relax your neck muscles completely before you start the next repetition. Repeat __________ times. Complete this exercise __________ times per day. STRENGTH- Cervical Lateral Flexors, Isometric   Stand about 6 inches away from a wall. Place a small  pillow, a ball about 6-8 inches in diameter, or a folded towel between the side of your head and the wall.  Slightly tuck your chin and gently tilt your head into the soft object. Push only with mild to moderate intensity, building up tension gradually. Keep your jaw and forehead relaxed.  Hold 10 to 20 seconds. Keep your breathing relaxed.  Release the tension slowly. Relax your neck muscles completely before you start the next repetition. Repeat __________ times. Complete this exercise __________ times per day. STRENGTH - Cervical Extensors, Isometric   Stand about 6 inches away from a wall. Place a small pillow, a ball about 6-8 inches in diameter, or a folded towel between the back of your head and the wall.  Slightly tuck your chin and gently tilt your head back into the soft object. Push only with mild to moderate intensity, building up tension gradually. Keep your jaw and forehead relaxed.  Hold 10 to 20 seconds. Keep your breathing relaxed.  Release the tension slowly. Relax your neck muscles completely before you start the next repetition. Repeat __________ times. Complete this exercise __________ times per day. POSTURE AND BODY MECHANICS CONSIDERATIONS - Cervical Strain and Sprain Keeping correct posture when sitting, standing or completing your activities will reduce the stress put on different body tissues, allowing injured tissues a chance to heal and limiting painful experiences. The following are general guidelines for improved posture. Your physician or physical therapist will provide you with any instructions specific to your needs. While reading these guidelines, remember:  The exercises prescribed by your provider will help you have the flexibility and strength to maintain correct postures.  The correct posture provides the optimal environment for your joints to work. All of your joints have less wear and tear when properly supported by a spine with good posture. This means  you will experience a healthier, less painful body.  Correct posture must be practiced with all of your activities, especially prolonged sitting and standing. Correct posture is as important when doing repetitive low-stress activities (typing) as it is when doing a single heavy-load activity (lifting). PROLONGED STANDING WHILE SLIGHTLY LEANING FORWARD When completing a task that requires you to lean forward while standing in one place for a long time, place either foot up on a stationary 2- to 4-inch high object to  help maintain the best posture. When both feet are on the ground, the low back tends to lose its slight inward curve. If this curve flattens (or becomes too large), then the back and your other joints will experience too much stress, fatigue more quickly, and can cause pain.  RESTING POSITIONS Consider which positions are most painful for you when choosing a resting position. If you have pain with flexion-based activities (sitting, bending, stooping, squatting), choose a position that allows you to rest in a less flexed posture. You would want to avoid curling into a fetal position on your side. If your pain worsens with extension-based activities (prolonged standing, working overhead), avoid resting in an extended position such as sleeping on your stomach. Most people will find more comfort when they rest with their spine in a more neutral position, neither too rounded nor too arched. Lying on a non-sagging bed on your side with a pillow between your knees, or on your back with a pillow under your knees will often provide some relief. Keep in mind, being in any one position for a prolonged period of time, no matter how correct your posture, can still lead to stiffness. WALKING Walk with an upright posture. Your ears, shoulders, and hips should all line up. OFFICE WORK When working at a desk, create an environment that supports good, upright posture. Without extra support, muscles fatigue and  lead to excessive strain on joints and other tissues. CHAIR:  A chair should be able to slide under your desk when your back makes contact with the back of the chair. This allows you to work closely.  The chair's height should allow your eyes to be level with the upper part of your monitor and your hands to be slightly lower than your elbows.  Body position:  Your feet should make contact with the floor. If this is not possible, use a foot rest.  Keep your ears over your shoulders. This will reduce stress on your neck and low back.   This information is not intended to replace advice given to you by your health care provider. Make sure you discuss any questions you have with your health care provider.   Document Released: 08/17/2005 Document Revised: 09/07/2014 Document Reviewed: 11/29/2008 Elsevier Interactive Patient Education 2016 Else[employer] Inc[employer]  Cryotherapy Cryotherapy means treatment with cold. Ice or gel packs can be used to reduce both pain and swelling. Ice is the most helpful within the first 24 to 48 hours after an injury or flare-up from overusing a muscle or joint. Sprains, strains, spasms, burning pain, shooting pain, and aches can all be eased with ice. Ice can also be used when recovering from surgery. Ice is effective, has very few side effects, and is safe for most people to use. PRECAUTIONS  Ice is not a safe treatment option for people with:  Raynaud phenomenon. This is a condition affecting small blood vessels in the extremities. Exposure to cold may cause your problems to return.  Cold hypersensitivity. There are many forms of cold hypersensitivity, including:  Cold urticaria. Red, itchy hives appear on the skin when the tissues begin to warm after being iced.  Cold erythema. This is a red, itchy rash caused by exposure to cold.  Cold hemoglobinuria. Red blood cells break down when the tissues begin to warm after being iced. The hemoglobin that carry oxygen are  passed into the urine because they cannot combine with blood proteins fast enough.  Numbness or altered sensitivity in the area being  iced. If you have any of the following conditions, do not use ice until you have discussed cryotherapy with your caregiver:  Heart conditions, such as arrhythmia, angina, or chronic heart disease.  High blood pressure.  Healing wounds or open skin in the area being iced.  Current infections.  Rheumatoid arthritis.  Poor circulation.  Diabetes. Ice slows the blood flow in the region it is applied. This is beneficial when trying to stop inflamed tissues from spreading irritating chemicals to surrounding tissues. However, if you expose your skin to cold temperatures for too long or without the proper protection, you can damage your skin or nerves. Watch for signs of skin damage due to cold. HOME CARE INSTRUCTIONS Follow these tips to use ice and cold packs safely.  Place a dry or damp towel between the ice and skin. A damp towel will cool the skin more quickly, so you may need to shorten the time that the ice is used.  For a more rapid response, add gentle compression to the ice.  Ice for no more than 10 to 20 minutes at a time. The bonier the area you are icing, the less time it will take to get the benefits of ice.  Check your skin after 5 minutes to make sure there are no signs of a poor response to cold or skin damage.  Rest 20 minutes or more between uses.  Once your skin is numb, you can end your treatment. You can test numbness by very lightly touching your skin. The touch should be so light that you do not see the skin dimple from the pressure of your fingertip. When using ice, most people will feel these normal sensations in this order: cold, burning, aching, and numbness.  Do not use ice on someone who cannot communicate their responses to pain, such as small children or people with dementia. HOW TO MAKE AN ICE PACK Ice packs are the most  common way to use ice therapy. Other methods include ice massage, ice baths, and cryosprays. Muscle creams that cause a cold, tingly feeling do not offer the same benefits that ice offers and should not be used as a substitute unless recommended by your caregiver. To make an ice pack, do one of the following:  Place crushed ice or a bag of frozen vegetables in a sealable plastic bag. Squeeze out the excess air. Place this bag inside another plastic bag. Slide the bag into a pillowcase or place a damp towel between your skin and the bag.  Mix 3 parts water with 1 part rubbing alcohol. Freeze the mixture in a sealable plastic bag. When you remove the mixture from the freezer, it will be slushy. Squeeze out the excess air. Place this bag inside another plastic bag. Slide the bag into a pillowcase or place a damp towel between your skin and the bag. SEEK MEDICAL CARE IF:  You develop white spots on your skin. This may give the skin a blotchy (mottled) appearance.  Your skin turns blue or pale.  Your skin becomes waxy or hard.  Your swelling gets worse. MAKE SURE YOU:   Understand these instructions.  Will watch your condition.  Will get help right away if you are not doing well or get worse.   This information is not intended to replace advice given to you by your health care provider. Make sure you discuss any questions you have with your health care provider.   Document Released: 04/13/2011 Document Revised: 09/07/2014 Document Reviewed:  04/13/2011 Elsevier Interactive Patient Education 2016 [employer] Inc[employer] Therapy Heat therapy can help ease sore, stiff, injured, and tight muscles and joints. Heat relaxes your muscles, which may help ease your pain.  RISKS AND COMPLICATIONS If you have any of the following conditions, do not use heat therapy unless your health care provider has approved:  Poor circulation.  Healing wounds or scarred skin in the area being treated.  Diabetes,  heart disease, or high blood pressure.  Not being able to feel (numbness) the area being treated.  Unusual swelling of the area being treated.  Active infections.  Blood clots.  Cancer.  Inability to communicate pain. This may include young children and people who have problems with their brain function (dementia).  Pregnancy. Heat therapy should only be used on old, pre-existing, or long-lasting (chronic) injuries. Do not use heat therapy on new injuries unless directed by your health care provider. HOW TO USE HEAT THERAPY There are several different kinds of heat therapy, including:  Moist heat pack.  Warm water bath.  Hot water bottle.  Electric heating pad.  Heated gel pack.  Heated wrap.  Electric heating pad. Use the heat therapy method suggested by your health care provider. Follow your health care provider's instructions on when and how to use heat therapy. GENERAL HEAT THERAPY RECOMMENDATIONS  Do not sleep while using heat therapy. Only use heat therapy while you are awake.  Your skin may turn pink while using heat therapy. Do not use heat therapy if your skin turns red.  Do not use heat therapy if you have new pain.  High heat or long exposure to heat can cause burns. Be careful when using heat therapy to avoid burning your skin.  Do not use heat therapy on areas of your skin that are already irritated, such as with a rash or sunburn. SEEK MEDICAL CARE IF:  You have blisters, redness, swelling, or numbness.  You have new pain.  Your pain is worse. MAKE SURE YOU:  Understand these instructions.  Will watch your condition.  Will get help right away if you are not doing well or get worse.   This information is not intended to replace advice given to you by your health care provider. Make sure you discuss any questions you have with your health care provider.   Document Released: 11/09/2011 Document Revised: 09/07/2014 Document Reviewed:  10/10/2013 Elsevier Interactive Patient Education 2016 [employer] Inc[employer] Back Exercises The following exercises strengthen the muscles that help to support the back. They also help to keep the lower back flexible. Doing these exercises can help to prevent back pain or lessen existing pain. If you have back pain or discomfort, try doing these exercises 2-3 times each day or as told by your health care provider. When the pain goes away, do them once each day, but increase the number of times that you repeat the steps for each exercise (do more repetitions). If you do not have back pain or discomfort, do these exercises once each day or as told by your health care provider. EXERCISES Single Knee to Chest Repeat these steps 3-5 times for each leg:  Lie on your back on a firm bed or the floor with your legs extended.  Bring one knee to your chest. Your other leg should stay extended and in contact with the floor.  Hold your knee in place by grabbing your knee or thigh.  Pull on your knee until you feel a gentle stretch in your lower  back.  Hold the stretch for 10-30 seconds.  Slowly release and straighten your leg. Pelvic Tilt Repeat these steps 5-10 times:  Lie on your back on a firm bed or the floor with your legs extended.  Bend your knees so they are pointing toward the ceiling and your feet are flat on the floor.  Tighten your lower abdominal muscles to press your lower back against the floor. This motion will tilt your pelvis so your tailbone points up toward the ceiling instead of pointing to your feet or the floor.  With gentle tension and even breathing, hold this position for 5-10 seconds. Cat-Cow Repeat these steps until your lower back becomes more flexible:  Get into a hands-and-knees position on a firm surface. Keep your hands under your shoulders, and keep your knees under your hips. You may place padding under your knees for comfort.  Let your head hang down, and point your  tailbone toward the floor so your lower back becomes rounded like the back of a cat.  Hold this position for 5 seconds.  Slowly lift your head and point your tailbone up toward the ceiling so your back forms a sagging arch like the back of a cow.  Hold this position for 5 seconds. Press-Ups Repeat these steps 5-10 times:  Lie on your abdomen (face-down) on the floor.  Place your palms near your head, about shoulder-width apart.  While you keep your back as relaxed as possible and keep your hips on the floor, slowly straighten your arms to raise the top half of your body and lift your shoulders. Do not use your back muscles to raise your upper torso. You may adjust the placement of your hands to make yourself more comfortable.  Hold this position for 5 seconds while you keep your back relaxed.  Slowly return to lying flat on the floor. Bridges Repeat these steps 10 times:  Lie on your back on a firm surface.  Bend your knees so they are pointing toward the ceiling and your feet are flat on the floor.  Tighten your buttocks muscles and lift your buttocks off of the floor until your waist is at almost the same height as your knees. You should feel the muscles working in your buttocks and the back of your thighs. If you do not feel these muscles, slide your feet 1-2 inches farther away from your buttocks.  Hold this position for 3-5 seconds.  Slowly lower your hips to the starting position, and allow your buttocks muscles to relax completely. If this exercise is too easy, try doing it with your arms crossed over your chest. Abdominal Crunches Repeat these steps 5-10 times:  Lie on your back on a firm bed or the floor with your legs extended.  Bend your knees so they are pointing toward the ceiling and your feet are flat on the floor.  Cross your arms over your chest.  Tip your chin slightly toward your chest without bending your neck.  Tighten your abdominal muscles and slowly  raise your trunk (torso) high enough to lift your shoulder blades a tiny bit off of the floor. Avoid raising your torso higher than that, because it can put too much stress on your low back and it does not help to strengthen your abdominal muscles.  Slowly return to your starting position. Back Lifts Repeat these steps 5-10 times: 5. Lie on your abdomen (face-down) with your arms at your sides, and rest your forehead on the floor. 6.  Tighten the muscles in your legs and your buttocks. 7. Slowly lift your chest off of the floor while you keep your hips pressed to the floor. Keep the back of your head in line with the curve in your back. Your eyes should be looking at the floor. 8. Hold this position for 3-5 seconds. 9. Slowly return to your starting position. SEEK MEDICAL CARE IF:  Your back pain or discomfort gets much worse when you do an exercise.  Your back pain or discomfort does not lessen within 2 hours after you exercise. If you have any of these problems, stop doing these exercises right away. Do not do them again unless your health care provider says that you can. SEEK IMMEDIATE MEDICAL CARE IF:  You develop sudden, severe back pain. If this happens, stop doing the exercises right away. Do not do them again unless your health care provider says that you can.   This information is not intended to replace advice given to you by your health care provider. Make sure you discuss any questions you have with your health care provider.   Document Released: 09/24/2004 Document Revised: 05/08/2015 Document Reviewed: 10/11/2014 Elsevier Interactive Patient Education 2016 Else[employer] Inc[employer] Thoracic Strain A thoracic strain, which is sometimes called a mid-back strain, is an injury to the muscles or tendons that attach to the upper part of your back behind your chest. This type of injury occurs when a muscle is overstretched or overloaded.  Thoracic strains can range from mild to severe. Mild  strains may involve stretching a muscle or tendon without tearing it. These injuries may heal in 1-2 weeks. More severe strains involve tearing of muscle fibers or tendons. These will cause more pain and may take 6-8 weeks to heal. CAUSES This condition may be caused by:  An injury in which a sudden force is placed on the muscle.  Exercising without properly warming up.  Overuse of the muscle.  Improper form during certain movements.  Other injuries that surround or cause stress on the mid-back, causing a strain on the muscles. In some cases, the cause may not be known. RISK FACTORS This injury is more common in:  Athletes.  People with obesity. SYMPTOMS The main symptom of this condition is pain, especially with movement. Other symptoms include:  Bruising.  Swelling.  Spasm. DIAGNOSIS This condition may be diagnosed with a physical exam. X-rays may be taken to check for a fracture. TREATMENT This condition may be treated with:  Resting and icing the injured area.  Physical therapy. This will involve doing stretching and strengthening exercises.  Medicines for pain and inflammation. HOME CARE INSTRUCTIONS  Rest as needed. Follow instructions from your health care provider about any restrictions on activity.  If directed, apply ice to the injured area:  Put ice in a plastic bag.  Place a towel between your skin and the bag.  Leave the ice on for 20 minutes, 2-3 times per day.  Take over-the-counter and prescription medicines only as told by your health care provider.  Begin doing exercises as told by your health care provider or physical therapist.  Always warm up properly before physical activity or sports.  Bend your knees before you lift heavy objects.  Keep all follow-up visits as told by your health care provider. This is important. SEEK MEDICAL CARE IF:  Your pain is not helped by medicine.  Your pain, bruising, or swelling is getting worse.  You  have a fever. SEEK IMMEDIATE MEDICAL CARE  IF: 10. You have shortness of breath. 11. You have chest pain. 12. You develop numbness or weakness in your legs. 13. You have involuntary loss of urine (urinary incontinence).   This information is not intended to replace advice given to you by your health care provider. Make sure you discuss any questions you have with your health care provider.   Document Released: 11/07/2003 Document Revised: 05/08/2015 Document Reviewed: 10/11/2014 Elsevier Interactive Patient Education [employer]

## 2015-09-22 ENCOUNTER — Ambulatory Visit
Admission: EM | Admit: 2015-09-22 | Discharge: 2015-09-22 | Disposition: A | Discharge status: Other Acute Inpatient Hospital | Payer: BLUE CROSS/BLUE SHIELD | Attending: Family Medicine | Admitting: Family Medicine

## 2015-09-22 ENCOUNTER — Encounter: Payer: Self-pay | Admitting: Gynecology

## 2015-09-22 DIAGNOSIS — R109 Unspecified abdominal pain: Secondary | ICD-10-CM | POA: Diagnosis not present

## 2015-09-22 LAB — URINALYSIS COMPLETE WITH MICROSCOPIC (ARMC ONLY)
Bilirubin Urine: NEGATIVE
GLUCOSE, UA: NEGATIVE mg/dL
Hgb urine dipstick: NEGATIVE
KETONES UR: NEGATIVE mg/dL
Leukocytes, UA: NEGATIVE
Nitrite: NEGATIVE
Protein, ur: NEGATIVE mg/dL
SPECIFIC GRAVITY, URINE: 1.02 (ref 1.005–1.030)
WBC UA: NONE SEEN WBC/hpf (ref 0–5)
pH: 5 (ref 5.0–8.0)

## 2015-09-22 LAB — PREGNANCY, URINE: PREG TEST UR: NEGATIVE

## 2015-09-22 NOTE — ED Provider Notes (Signed)
Patient presents today with symptoms of right-sided abdominal pain. Patient states that she had nausea and nonbloody diarrhea a few days ago. The diarrhea has stopped however she has felt some nausea. She denies any fever however did have night sweats yesterday. She denies any chest pain, shortness of breath, dizziness, severe headache, vomiting. She denies any history of inflammatory bowel disease. She is breast-feeding her 74-month-old however state she does have her menstrual periods monthly though. Her last menstrual period was about 3 weeks ago.  ROS: Negative except mentioned above.  Vitals as per Epic. GENERAL: NAD, not toxic appearing  HEENT: no pharyngeal erythema, no exudate, no erythema of TMs  RESP: CTA B CARD: RRR ABD: +BS, tenderness in RLQ, no rebound or guarding, no flank tenderness NEURO: CN II-XII grossly intact   A/P: RLQ Pain- UA and urine pregnancy (negative)were done, recommend labs and doing CT to further evaluate, patient go to the ER at this time for further evaluation and treatment as discussed.   Jolene Provost, MD 09/22/15 [phone]

## 2015-09-22 NOTE — ED Notes (Signed)
Patient c/o right side abdomen pain. Patient also c/o of a lot of acid reflux.

## 2015-09-25 LAB — URINE CULTURE: Culture: 10000

## 2015-09-30 ENCOUNTER — Telehealth: Payer: Self-pay | Admitting: *Deleted

## 2015-09-30 NOTE — ED Notes (Signed)
Called patient, 2nd attempt, no answer. Left message on answering machine to call back.

## 2015-12-31 ENCOUNTER — Encounter: Payer: Self-pay | Admitting: Obstetrics and Gynecology

## 2015-12-31 ENCOUNTER — Ambulatory Visit (INDEPENDENT_AMBULATORY_CARE_PROVIDER_SITE_OTHER): Payer: BLUE CROSS/BLUE SHIELD | Admitting: Obstetrics and Gynecology

## 2015-12-31 VITALS — BP 125/86 | HR 93 | Ht 63.0 in | Wt 222.7 lb

## 2015-12-31 DIAGNOSIS — E669 Obesity, unspecified: Secondary | ICD-10-CM | POA: Diagnosis not present

## 2015-12-31 DIAGNOSIS — N912 Amenorrhea, unspecified: Secondary | ICD-10-CM | POA: Diagnosis not present

## 2015-12-31 LAB — POCT URINE PREGNANCY: Preg Test, Ur: NEGATIVE

## 2015-12-31 NOTE — Progress Notes (Signed)
Patient ID: Darlene FastSelena Giddings, female   DOB: Nov 19, 1975, 40 y.o.   MRN: [id number]  S: reports 1 week late for menses with + UPT 4 days ago; started menstrual like bleeding last night that has continued until today. Mild cramping, Is still breast feeding 7510 month old. Not using contraception and happy about potential pregnancy. LMP 11/21/15  O: A&O x4  well groomed female Blood pressure 125/86, pulse 93, height 5\' 3"  (1.6 m), weight 222 lb 11.2 oz (101.016 kg), last menstrual period 11/24/2015, currently breastfeeding. UPT- Pelvic deferred.  A: late menses vs. SAB  P: Quant bhcg obtained, will follow up accordingly. Counseled on miscarriage and expected outcomes, verses late mense and false positive home UPT. No questions at this time,  RTC PRN.  Justyce Yeater [place], CNM

## 2015-12-31 NOTE — Patient Instructions (Signed)
Thank you for enrolling in MyChart. Please follow the instructions below to securely access your online medical record. MyChart allows you to send messages to your doctor, view your test results, renew your prescriptions, schedule appointments, and more.  How Do I Sign Up? 1. In your Internet browser, go to [email or web address]. 2. Click on the New  User? link in the Sign In box.  3. Enter your MyChart Access Code exactly as it appears below. You will not need to use this code after you have completed the sign-up process. If you do not sign up before the expiration date, you must request a new code. MyChart Access Code: [email or web address] Expires: 02/21/2016  9:48 AM  4. Enter the last four digits of your Social Security Number (xxxx) and Date of Birth (mm/dd/yyyy) as indicated and click Next. You will be taken to the next sign-up page. 5. Create a MyChart ID. This will be your MyChart login ID and cannot be changed, so think of one that is secure and easy to remember. 6. Create a MyChart password. You can change your password at any time. 7. Enter your Password Reset Question and Answer and click Next. This can be used at a later time if you forget your password.  8. Select your communication preference, and if applicable enter your e-mail address. You will receive e-mail notification when new information is available in MyChart by choosing to receive e-mail notifications and filling in your e-mail. 9. Click Sign In. You can now view your medical record.   Additional Information If you have questions, you can email [email or web address] or call [phone] to talk to our MyChart staff. Remember, MyChart is NOT to be used for urgent needs. For medical emergencies, dial 911. Miscarriage A miscarriage is the sudden loss of an unborn baby (fetus) before the 20th week of pregnancy. Most miscarriages happen in the first 3 months of pregnancy. Sometimes, it happens before a  woman even knows she is pregnant. A miscarriage is also called a "spontaneous miscarriage" or "early pregnancy loss." Having a miscarriage can be an emotional experience. Talk with your caregiver about any questions you may have about miscarrying, the grieving process, and your future pregnancy plans. CAUSES   Problems with the fetal chromosomes that make it impossible for the baby to develop normally. Problems with the baby's genes or chromosomes are most often the result of errors that occur, by chance, as the embryo divides and grows. The problems are not inherited from the parents.  Infection of the cervix or uterus.   Hormone problems.   Problems with the cervix, such as having an incompetent cervix. This is when the tissue in the cervix is not strong enough to hold the pregnancy.   Problems with the uterus, such as an abnormally shaped uterus, uterine fibroids, or congenital abnormalities.   Certain medical conditions.   Smoking, drinking alcohol, or taking illegal drugs.   Trauma.  Often, the cause of a miscarriage is unknown.  SYMPTOMS   Vaginal bleeding or spotting, with or without cramps or pain.  Pain or cramping in the abdomen or lower back.  Passing fluid, tissue, or blood clots from the vagina. DIAGNOSIS  Your caregiver will perform a physical exam. You may also have an ultrasound to confirm the miscarriage. Blood or urine tests may also be ordered. TREATMENT   Sometimes, treatment is not necessary if you naturally pass all the fetal tissue that was in the uterus. If some of the  fetus or placenta remains in the body (incomplete miscarriage), tissue left behind may become infected and must be removed. Usually, a dilation and curettage (D and C) procedure is performed. During a D and C procedure, the cervix is widened (dilated) and any remaining fetal or placental tissue is gently removed from the uterus.  Antibiotic medicines are prescribed if there is an  infection. Other medicines may be given to reduce the size of the uterus (contract) if there is a lot of bleeding.  If you have Rh negative blood and your baby was Rh positive, you will need a Rh immunoglobulin shot. This shot will protect any future baby from having Rh blood problems in future pregnancies. HOME CARE INSTRUCTIONS   Your caregiver may order bed rest or may allow you to continue light activity. Resume activity as directed by your caregiver.  Have someone help with home and family responsibilities during this time.   Keep track of the number of sanitary pads you use each day and how soaked (saturated) they are. Write down this information.   Do not use tampons. Do not douche or have sexual intercourse until approved by your caregiver.   Only take over-the-counter or prescription medicines for pain or discomfort as directed by your caregiver.   Do not take aspirin. Aspirin can cause bleeding.   Keep all follow-up appointments with your caregiver.   If you or your partner have problems with grieving, talk to your caregiver or seek counseling to help cope with the pregnancy loss. Allow enough time to grieve before trying to get pregnant again.  SEEK IMMEDIATE MEDICAL CARE IF:   You have severe cramps or pain in your back or abdomen.  You have a fever.  You pass large blood clots (walnut-sized or larger) ortissue from your vagina. Save any tissue for your caregiver to inspect.   Your bleeding increases.   You have a thick, bad-smelling vaginal discharge.  You become lightheaded, weak, or you faint.   You have chills.  MAKE SURE YOU:  Understand these instructions.  Will watch your condition.  Will get help right away if you are not doing well or get worse.   This information is not intended to replace advice given to you by your health care provider. Make sure you discuss any questions you have with your health care provider.   Document Released:  02/10/2001 Document Revised: 12/12/2012 Document Reviewed: 10/06/2011 Elsevier Interactive Patient Education [employer]

## 2016-01-01 LAB — BETA HCG QUANT (REF LAB): hCG Quant: 2 m[IU]/mL

## 2016-01-08 ENCOUNTER — Ambulatory Visit: Payer: BC Managed Care – PPO | Admitting: Obstetrics and Gynecology

## 2016-03-04 ENCOUNTER — Encounter: Payer: Self-pay | Admitting: Obstetrics and Gynecology

## 2016-03-18 ENCOUNTER — Encounter: Payer: Self-pay | Admitting: Obstetrics and Gynecology

## 2016-03-18 ENCOUNTER — Ambulatory Visit (INDEPENDENT_AMBULATORY_CARE_PROVIDER_SITE_OTHER): Payer: BLUE CROSS/BLUE SHIELD | Admitting: Obstetrics and Gynecology

## 2016-03-18 VITALS — BP 113/73 | HR 91 | Ht 63.0 in | Wt 225.0 lb

## 2016-03-18 DIAGNOSIS — R638 Other symptoms and signs concerning food and fluid intake: Secondary | ICD-10-CM | POA: Diagnosis not present

## 2016-03-18 DIAGNOSIS — O09529 Supervision of elderly multigravida, unspecified trimester: Secondary | ICD-10-CM | POA: Insufficient documentation

## 2016-03-18 DIAGNOSIS — Z3490 Encounter for supervision of normal pregnancy, unspecified, unspecified trimester: Secondary | ICD-10-CM

## 2016-03-18 DIAGNOSIS — N912 Amenorrhea, unspecified: Secondary | ICD-10-CM

## 2016-03-18 DIAGNOSIS — Z3481 Encounter for supervision of other normal pregnancy, first trimester: Secondary | ICD-10-CM

## 2016-03-18 DIAGNOSIS — O09521 Supervision of elderly multigravida, first trimester: Secondary | ICD-10-CM | POA: Diagnosis not present

## 2016-03-18 LAB — POCT URINE PREGNANCY: Preg Test, Ur: POSITIVE — AB

## 2016-03-18 NOTE — Patient Instructions (Signed)
1. Nursing OB intake visit-3 weeks 2. New OB history and physical-5 weeks

## 2016-03-18 NOTE — Progress Notes (Addendum)
GYN ENCOUNTER NOTE  Subjective:       Darlene Escobar is a 40 y.o. G71P1001 female is here for gynecologic evaluation of the following issues:  1. Pregnancy confirmation.    LMP 01/31/2016 EDD 11/06/2016  EGA 6.5 weeks  Patient has positive UPT today. She is experiencing mild nausea without vomiting. She has noted breast tenderness; currently is breast-feeding. She denies vaginal bleeding or pelvic pain.  Patient delivered approximately one year ago; pregnancy was uncomplicated.   Gynecologic History Patient's last menstrual period was 01/31/2016 (exact date).  Obstetric History OB History  Gravida Para Term Preterm AB SAB TAB Ectopic Multiple Living  0 1    # Outcome Date GA Lbr Len/2nd Weight Sex Delivery Anes PTL Lv  2 Current           1 Term 03/05/15 [redacted]w[redacted]d / 00:17 8 lb 8.5 oz (3.87 kg) M Vag-Spont None  Y      Past Medical History  Diagnosis Date  . Mild asthma     Past Surgical History  Procedure Laterality Date  . Tonsillectomy      Current Outpatient Prescriptions on File Prior to Visit  Medication Sig Dispense Refill  . albuterol (PROVENTIL HFA;VENTOLIN HFA) 108 (90 BASE) MCG/ACT inhaler Inhale 1 puff into the lungs every 6 (six) hours as needed for wheezing or shortness of breath.    . Cholecalciferol (VITAMIN D) 2000 UNITS CAPS Take 1 capsule (2,000 Units total) by mouth as directed. 90 capsule 2  . Prenatal Vit-Fe Fumarate-FA (PRENATAL MULTIVITAMIN) TABS tablet Take 1 tablet by mouth daily.      No current facility-administered medications on file prior to visit.    No Known Allergies  Social History   Social History  . Marital Status: Married    Spouse Name: N/A  . Number of Children: N/A  . Years of Education: N/A   Occupational History  . Not on file.   Social History Main Topics  . Smoking status: Never Smoker   . Smokeless tobacco: Never Used  . Alcohol Use: No  . Drug Use: No  . Sexual Activity: Yes    Birth Control/  Protection: None   Other Topics Concern  . Not on file   Social History Narrative    Family History  Problem Relation Age of Onset  . Diabetes Mother   . Heart disease Father   . Cancer Neg Hx     The following portions of the patient's history were reviewed and updated as appropriate: allergies, current medications, past family history, past medical history, past social history, past surgical history and problem list.  Review of Systems Review of Systems -per history of present illness  Objective:   BP 113/73 mmHg  Pulse 91  Ht  (1.6 m)  Wt 225 lb (102.059 kg)  BMI 39.87 kg/m2  LMP 01/31/2016 (Exact Date)  Breastfeeding? Yes Physical exam-deferred   Assessment:   1. Amenorrhea - POCT urine pregnancy  2. Pregnancy  3. Increased BMI  4. Advanced maternal age    Plan:   1. UPT-positive 2. Increased body mass index 3.New OB counseling: The patient has been given an overview regarding routine prenatal care. Recommendations regarding diet, weight gain, and exercise in pregnancy were given. Prenatal testing, optional genetic testing, and ultrasound use in pregnancy were reviewed.  Benefits of Breast Feeding were discussed. The patient is encouraged to consider nursing her baby post partum. 4. Return in 3  weeks for nursing intake appointment; patient does desire panorama and this may be drawn with new OB labs 5. Return in 5 weeks for new OB history and physical

## 2016-04-01 ENCOUNTER — Other Ambulatory Visit (INDEPENDENT_AMBULATORY_CARE_PROVIDER_SITE_OTHER): Payer: BLUE CROSS/BLUE SHIELD

## 2016-04-01 ENCOUNTER — Other Ambulatory Visit: Payer: Self-pay | Admitting: Obstetrics and Gynecology

## 2016-04-01 ENCOUNTER — Ambulatory Visit (INDEPENDENT_AMBULATORY_CARE_PROVIDER_SITE_OTHER): Payer: BLUE CROSS/BLUE SHIELD | Admitting: Obstetrics and Gynecology

## 2016-04-01 VITALS — BP 107/76 | HR 62 | Wt 222.0 lb

## 2016-04-01 DIAGNOSIS — O09521 Supervision of elderly multigravida, first trimester: Secondary | ICD-10-CM

## 2016-04-01 DIAGNOSIS — T7589XA Other specified effects of external causes, initial encounter: Secondary | ICD-10-CM

## 2016-04-01 DIAGNOSIS — O3680X1 Pregnancy with inconclusive fetal viability, fetus 1: Secondary | ICD-10-CM

## 2016-04-01 DIAGNOSIS — R638 Other symptoms and signs concerning food and fluid intake: Secondary | ICD-10-CM

## 2016-04-01 DIAGNOSIS — Z369 Encounter for antenatal screening, unspecified: Secondary | ICD-10-CM

## 2016-04-01 DIAGNOSIS — Z36 Encounter for antenatal screening of mother: Secondary | ICD-10-CM

## 2016-04-01 DIAGNOSIS — Z1389 Encounter for screening for other disorder: Secondary | ICD-10-CM

## 2016-04-01 DIAGNOSIS — Z113 Encounter for screening for infections with a predominantly sexual mode of transmission: Secondary | ICD-10-CM

## 2016-04-01 DIAGNOSIS — Z3481 Encounter for supervision of other normal pregnancy, first trimester: Secondary | ICD-10-CM

## 2016-04-01 DIAGNOSIS — Z3490 Encounter for supervision of normal pregnancy, unspecified, unspecified trimester: Secondary | ICD-10-CM

## 2016-04-01 NOTE — Progress Notes (Signed)
Darlene Escobar presents for NOB nurse interview visit. G-2.  P-1001. preganancy confirmation done by Dr. Greggory Keen on 03/18/2016. Pregnancy education material explained and given. Has one cat in the home. NOB labs ordered. TSH/HbgA1c due to Increased BMI, HIV labs and Drug screen were explained optional and she could opt out of tests but did not decline. Drug screen ordered. PNV encouraged. Pt is interested in having the Panorama genetic testing done. Ultrasound for dating and viablity ordered for AMA. Continues with nausea but will try the B6 25mg  3xd and Unisom at bedtime if needed. Pt is breast feeding. Pt. To follow up with provider in 4 weeks for NOB physical.  All questions answered. Ultrasound done and pt is 7 days earlier than LMP estimated. EGA: 7wks 5days.

## 2016-04-01 NOTE — Patient Instructions (Signed)
Pregnancy and Zika Virus Disease Zika virus disease, or Zika, is an illness that can spread to people from mosquitoes that carry the virus. It may also spread from person to person through infected body fluids. Zika first occurred in [place], but recently it has spread to new areas. The virus occurs in tropical climates. The location of Zika continues to change. Most people who become infected with Zika virus do not develop serious illness. However, Zika may cause birth defects in an unborn baby whose mother is infected with the virus. It may also increase the risk of miscarriage. WHAT ARE THE SYMPTOMS OF ZIKA VIRUS DISEASE? In many cases, people who have been infected with Zika virus do not develop any symptoms. If symptoms appear, they usually start about a week after the person is infected. Symptoms are usually mild. They may include:  Fever.  Rash.  Red eyes.  Joint pain. HOW DOES ZIKA VIRUS DISEASE SPREAD? The main way that Zika virus spreads is through the bite of a certain type of mosquito. Unlike most types of mosquitos, which bite only at night, the type of mosquito that carries Zika virus bites both at night and during the day. Zika virus can also spread through sexual contact, through a blood transfusion, and from a mother to her baby before or during birth. Once you have had Zika virus disease, it is unlikely that you will get it again. CAN I PASS ZIKA TO MY BABY DURING PREGNANCY? Yes, Zika can pass from a mother to her baby before or during birth. WHAT PROBLEMS CAN ZIKA CAUSE FOR MY BABY? A woman who is infected with Zika virus while pregnant is at risk of having her baby born with a condition in which the brain or head is smaller than expected (microcephaly). Babies who have microcephaly can have developmental delays, seizures, hearing problems, and vision problems. Having Zika virus disease during pregnancy can also increase the risk of miscarriage. HOW CAN ZIKA VIRUS DISEASE BE  PREVENTED? There is no vaccine to prevent Zika. The best way to prevent the disease is to avoid infected mosquitoes and avoid exposure to body fluids that can spread the virus. Avoid any possible exposure to Zika by taking the following precautions. For women and their sex partners:  Avoid traveling to high-risk areas. The locations where Zika is being reported change often. To identify high-risk areas, check the CDC travel website: www.cdc.gov/zika/geo/index.html  If you or your sex partner must travel to a high-risk area, talk with a health care provider before and after traveling.  Take all precautions to avoid mosquito bites if you live in, or travel to, any of the high-risk areas. Insect repellents are safe to use during pregnancy.  Ask your health care provider when it is safe to have sexual contact. For women:  If you are pregnant or trying to become pregnant, avoid sexual contact with persons who may have been exposed to Zika virus, persons who have possible symptoms of Zika, or persons whose history you are unsure about. If you choose to have sexual contact with someone who may have been exposed to Zika virus, use condoms correctly during the entire duration of sexual activity, every time. Do not share sexual devices, as you may be exposed to body fluids.  Ask your health care provider about when it is safe to attempt pregnancy after a possible exposure to Zika virus. WHAT STEPS SHOULD I TAKE TO AVOID MOSQUITO BITES? Take these steps to avoid mosquito bites when you are   in a high-risk area:  Wear loose clothing that covers your arms and legs.  Limit your outdoor activities.  Do not open windows unless they have window screens.  Sleep under mosquito nets.  Use insect repellent. The best insect repellents have:  DEET, picaridin, oil of lemon eucalyptus (OLE), or [id number] in them.  Higher amounts of an active ingredient in them.  Remember that insect repellents are safe to use  during pregnancy.  Do not use OLE on children who are younger than 3 years of age. Do not use insect repellent on babies who are younger than 2 months of age.  Cover your child's stroller with mosquito netting. Make sure the netting fits snugly and that any loose netting does not cover your child's mouth or nose. Do not use a blanket as a mosquito-protection cover.  Do not apply insect repellent underneath clothing.  If you are using sunscreen, apply the sunscreen before applying the insect repellent.  Treat clothing with permethrin. Do not apply permethrin directly to your skin. Follow label directions for safe use.  Get rid of standing water, where mosquitoes may reproduce. Standing water is often found in items such as buckets, bowls, animal food dishes, and flowerpots. When you return from traveling to any high-risk area, continue taking actions to protect yourself against mosquito bites for 3 weeks, even if you show no signs of illness. This will prevent spreading Zika virus to uninfected mosquitoes. WHAT SHOULD I KNOW ABOUT THE SEXUAL TRANSMISSION OF ZIKA? People can spread Zika to their sexual partners during vaginal, anal, or oral sex, or by sharing sexual devices. Many people with Zika do not develop symptoms, so a person could spread the disease without knowing that they are infected. The greatest risk is to women who are pregnant or who may become pregnant. Zika virus can live longer in semen than it can live in blood. Couples can prevent sexual transmission of the virus by:  Using condoms correctly during the entire duration of sexual activity, every time. This includes vaginal, anal, and oral sex.  Not sharing sexual devices. Sharing increases your risk of being exposed to body fluid from another person.  Avoiding all sexual activity until your health care provider says it is safe. SHOULD I BE TESTED FOR ZIKA VIRUS? A sample of your blood can be tested for Zika virus. A pregnant  woman should be tested if she may have been exposed to the virus or if she has symptoms of Zika. She may also have additional tests done during her pregnancy, such ultrasound testing. Talk with your health care provider about which tests are recommended.   This information is not intended to replace advice given to you by your health care provider. Make sure you discuss any questions you have with your health care provider.   Document Released: 05/08/2015 Document Reviewed: 05/01/2015 Elsevier Interactive Patient Education 2016 [employer] Pregnancy and Toxoplasmosis Toxoplasmosis is an infection caused by a parasite. Your body can usually fight off the infection without you ever having symptoms. If you get toxoplasmosis during pregnancy, there is a chance the infection will spread to your baby. If this happens, your baby may develop serious health problems, such as blindness. Some of these problems may not show up for years. HOW DO PEOPLE GET TOXOPLASMOSIS? You can get toxoplasmosis if you:  Touch anything contaminated with infected cat feces and then touch your mouth.  Eat undercooked meat from an infected animal.  Eat fruits and vegetables grown in contaminated   soil. Your baby can get toxoplasmosis through your blood supply if you are infected during pregnancy or just before pregnancy. HOW CAN I PROTECT MYSELF AND MY BABY AGAINST TOXOPLASMOSIS?   Do not get a new cat.  Do not touch stray cats.  If you have a sandbox, cover it when it is not being used.  Avoid working in soil where cats may leave feces.  Wear gloves when you work in the soil. Wash your hands with soap and water when you are finished.  If you have a cat:  Have someone else change the cat's litter box. He or she should wash his or her hands afterward.  Do not let your cat outside.  Do not feed your cat raw meat.  Do not eat undercooked meat, especially meat that has never been frozen.  Wash and peel all  fruits and vegetables before eating them.  If you have toxoplasmosis and are not pregnant, wait at least 6 months before becoming pregnant. HOW DO I KNOW IF I HAVE TOXOPLASMOSIS? The only way to know for sure is with a test. People with toxoplasmosis do not always have symptoms. If symptoms are present, they may include:  A fever.  Swollen glands.  Muscle aches.  Headaches. You may feel like you have the cold or the flu. If you think you have toxoplasmosis, or if you think you may have been exposed to it, call your health care provider. HOW IS TOXOPLASMOSIS DIAGNOSED? When you become pregnant, your health care provider may order a blood test to check whether you have ever had toxoplasmosis. If you have had toxoplasmosis before, you cannot get it again. If you have never had toxoplasmosis, your health care provider may repeat this test at a later date. If you become infected during pregnancy, your health care provider may do more tests to find out whether the infection has spread to the baby. Tests may include amniocentesis and an ultrasound. HOW IS TOXOPLASMOSIS TREATED? Toxoplasmosis may be treated with antibiotics and other medicines. Some of these medicines can lower your baby's chance of developing complications later on. Medicines may need to be taken for up to one year.  WHAT SHOULD I DO AT HOME IF I AM DIAGNOSED WITH TOXOPLASMOSIS?  Only take medicine as directed by your health care provider.  Take antibiotic medicine as directed. Make sure to finish it even if you start to feel better.  Keep all follow-up appointments.   This information is not intended to replace advice given to you by your health care provider. Make sure you discuss any questions you have with your health care provider.   Document Released: 11/23/2000 Document Revised: 09/07/2014 Document Reviewed: 07/19/2013 Elsevier Interactive Patient Education 2016 [employer] Minor Illnesses and Medications in  Pregnancy  Cold/Flu:  Sudafed for congestion- Robitussin (plain) for cough- Tylenol for discomfort.  Please follow the directions on the label.  Try not to take any more than needed.  OTC Saline nasal spray and air humidifier or cool-mist  Vaporizer to sooth nasal irritation and to loosen congestion.  It is also important to increase intake of non carbonated fluids, especially if you have a fever.  Constipation:  Colace-2 capsules at bedtime; Metamucil- follow directions on label; Senokot- 1 tablet at bedtime.  Any one of these medications can be used.  It is also very important to increase fluids and fruits along with regular exercise.  If problem persists please call the office.  Diarrhea:  Kaopectate as directed on the   label.  Eat a bland diet and increase fluids.  Avoid highly seasoned foods.  Headache:  Tylenol 1 or 2 tablets every 3-4 hours as needed  Indigestion:  Maalox, Mylanta, Tums or Rolaids- as directed on label.  Also try to eat small meals and avoid fatty, greasy or spicy foods.  Nausea with or without Vomiting:  Nausea in pregnancy is caused by increased levels of hormones in the body which influence the digestive system and cause irritation when stomach acids accumulate.  Symptoms usually subside after 1st trimester of pregnancy.  Try the following: 1. Keep saltines, graham crackers or dry toast by your bed to eat upon awakening. 2. Don't let your stomach get empty.  Try to eat 5-6 small meals per day instead of 3 large ones. 3. Avoid greasy fatty or highly seasoned foods.  4. Take OTC Unisom 1 tablet at bed time along with OTC Vitamin B6 25-50 mg 3 times per day.    If nausea continues with vomiting and you are unable to keep down food and fluids you may need a prescription medication.  Please notify your provider.   Sore throat:  Chloraseptic spray, throat lozenges and or plain Tylenol.  Vaginal Yeast Infection:  OTC Monistat for 7 days as directed on label.  If symptoms do  not resolve within a week notify provider.  If any of the above problems do not subside with recommended treatment please call the office for further assistance.   Do not take Aspirin, Advil, Motrin or Ibuprofen.  * * OTC= Over the counter Hyperemesis Gravidarum Hyperemesis gravidarum is a severe form of nausea and vomiting that happens during pregnancy. Hyperemesis is worse than morning sickness. It may cause you to have nausea or vomiting all day for many days. It may keep you from eating and drinking enough food and liquids. Hyperemesis usually occurs during the first half (the first 20 weeks) of pregnancy. It often goes away once a woman is in her second half of pregnancy. However, sometimes hyperemesis continues through an entire pregnancy.  CAUSES  The cause of this condition is not completely known but is thought to be related to changes in the body's hormones when pregnant. It could be from the high level of the pregnancy hormone or an increase in estrogen in the body.  SIGNS AND SYMPTOMS   Severe nausea and vomiting.  Nausea that does not go away.  Vomiting that does not allow you to keep any food down.  Weight loss and body fluid loss (dehydration).  Having no desire to eat or not liking food you have previously enjoyed. DIAGNOSIS  Your health care provider will do a physical exam and ask you about your symptoms. He or she may also order blood tests and urine tests to make sure something else is not causing the problem.  TREATMENT  You may only need medicine to control the problem. If medicines do not control the nausea and vomiting, you will be treated in the hospital to prevent dehydration, increased acid in the blood (acidosis), weight loss, and changes in the electrolytes in your body that may harm the unborn baby (fetus). You may need IV fluids.  HOME CARE INSTRUCTIONS   Only take over-the-counter or prescription medicines as directed by your health care provider.  Try eating  a couple of dry crackers or toast in the morning before getting out of bed.  Avoid foods and smells that upset your stomach.  Avoid fatty and spicy foods.  Eat 5-6   small meals a day.  Do not drink when eating meals. Drink between meals.  For snacks, eat high-protein foods, such as cheese.  Eat or suck on things that have ginger in them. Ginger helps nausea.  Avoid food preparation. The smell of food can spoil your appetite.  Avoid iron pills and iron in your multivitamins until after 3-4 months of being pregnant. However, consult with your health care provider before stopping any prescribed iron pills. SEEK MEDICAL CARE IF:   Your abdominal pain increases.  You have a severe headache.  You have vision problems.  You are losing weight. SEEK IMMEDIATE MEDICAL CARE IF:   You are unable to keep fluids down.  You vomit blood.  You have constant nausea and vomiting.  You have excessive weakness.  You have extreme thirst.  You have dizziness or fainting.  You have a fever or persistent symptoms for more than 2-3 days.  You have a fever and your symptoms suddenly get worse. MAKE SURE YOU:   Understand these instructions.  Will watch your condition.  Will get help right away if you are not doing well or get worse.   This information is not intended to replace advice given to you by your health care provider. Make sure you discuss any questions you have with your health care provider.   Document Released: 08/17/2005 Document Revised: 06/07/2013 Document Reviewed: 03/29/2013 Elsevier Interactive Patient Education 2016 [employer] Commonly Asked Questions During Pregnancy  Cats: A parasite can be excreted in cat feces.  To avoid exposure you need to have another person empty the little box.  If you must empty the litter box you will need to wear gloves.  Wash your hands after handling your cat.  This parasite can also be found in raw or undercooked meat so this should  also be avoided.  Colds, Sore Throats, Flu: Please check your medication sheet to see what you can take for symptoms.  If your symptoms are unrelieved by these medications please call the office.  Dental Work: Most any dental work your dentist recommends is permitted.  X-rays should only be taken during the first trimester if absolutely necessary.  Your abdomen should be shielded with a lead apron during all x-rays.  Please notify your provider prior to receiving any x-rays.  Novocaine is fine; gas is not recommended.  If your dentist requires a note from us prior to dental work please call the office and we will provide one for you.  Exercise: Exercise is an important part of staying healthy during your pregnancy.  You may continue most exercises you were accustomed to prior to pregnancy.  Later in your pregnancy you will most likely notice you have difficulty with activities requiring balance like riding a bicycle.  It is important that you listen to your body and avoid activities that put you at a higher risk of falling.  Adequate rest and staying well hydrated are a must!  If you have questions about the safety of specific activities ask your provider.    Exposure to Children with illness: Try to avoid obvious exposure; report any symptoms to us when noted,  If you have chicken pos, red measles or mumps, you should be immune to these diseases.   Please do not take any vaccines while pregnant unless you have checked with your OB provider.  Fetal Movement: After 28 weeks we recommend you do "kick counts" twice daily.  Lie or sit down in a calm quiet environment and   count your baby movements "kicks".  You should feel your baby at least 10 times per hour.  If you have not felt 10 kicks within the first hour get up, walk around and have something sweet to eat or drink then repeat for an additional hour.  If count remains less than 10 per hour notify your provider.  Fumigating: Follow your pest control  agent's advice as to how long to stay out of your home.  Ventilate the area well before re-entering.  Hemorrhoids:   Most over-the-counter preparations can be used during pregnancy.  Check your medication to see what is safe to use.  It is important to use a stool softener or fiber in your diet and to drink lots of liquids.  If hemorrhoids seem to be getting worse please call the office.   Hot Tubs:  Hot tubs Jacuzzis and saunas are not recommended while pregnant.  These increase your internal body temperature and should be avoided.  Intercourse:  Sexual intercourse is safe during pregnancy as long as you are comfortable, unless otherwise advised by your provider.  Spotting may occur after intercourse; report any bright red bleeding that is heavier than spotting.  Labor:  If you know that you are in labor, please go to the hospital.  If you are unsure, please call the office and let us help you decide what to do.  Lifting, straining, etc:  If your job requires heavy lifting or straining please check with your provider for any limitations.  Generally, you should not lift items heavier than that you can lift simply with your hands and arms (no back muscles)  Painting:  Paint fumes do not harm your pregnancy, but may make you ill and should be avoided if possible.  Latex or water based paints have less odor than oils.  Use adequate ventilation while painting.  Permanents & Hair Color:  Chemicals in hair dyes are not recommended as they cause increase hair dryness which can increase hair loss during pregnancy.  " Highlighting" and permanents are allowed.  Dye may be absorbed differently and permanents may not hold as well during pregnancy.  Sunbathing:  Use a sunscreen, as skin burns easily during pregnancy.  Drink plenty of fluids; avoid over heating.  Tanning Beds:  Because their possible side effects are still unknown, tanning beds are not recommended.  Ultrasound Scans:  Routine ultrasounds are  performed at approximately 20 weeks.  You will be able to see your baby's general anatomy an if you would like to know the gender this can usually be determined as well.  If it is questionable when you conceived you may also receive an ultrasound early in your pregnancy for dating purposes.  Otherwise ultrasound exams are not routinely performed unless there is a medical necessity.  Although you can request a scan we ask that you pay for it when conducted because insurance does not cover " patient request" scans.  Work: If your pregnancy proceeds without complications you may work until your due date, unless your physician or employer advises otherwise.  Round Ligament Pain/Pelvic Discomfort:  Sharp, shooting pains not associated with bleeding are fairly common, usually occurring in the second trimester of pregnancy.  They tend to be worse when standing up or when you remain standing for long periods of time.  These are the result of pressure of certain pelvic ligaments called "round ligaments".  Rest, Tylenol and heat seem to be the most effective relief.  As the womb and fetus   grow, they rise out of the pelvis and the discomfort improves.  Please notify the office if your pain seems different than that described.  It may represent a more serious condition.   

## 2016-04-02 LAB — NICOTINE SCREEN, URINE: Cotinine Ql Scrn, Ur: NEGATIVE ng/mL

## 2016-04-02 LAB — URINALYSIS, ROUTINE W REFLEX MICROSCOPIC
Bilirubin, UA: NEGATIVE
Glucose, UA: NEGATIVE
Ketones, UA: NEGATIVE
Leukocytes, UA: NEGATIVE
Nitrite, UA: NEGATIVE
RBC, UA: NEGATIVE
Specific Gravity, UA: 1.023 (ref 1.005–1.030)
Urobilinogen, Ur: 0.2 mg/dL (ref 0.2–1.0)
pH, UA: 6.5 (ref 5.0–7.5)

## 2016-04-02 LAB — MONITOR DRUG PROFILE 14(MW)
AMPHETAMINE SCREEN URINE: NEGATIVE ng/mL
BARBITURATE SCREEN URINE: NEGATIVE ng/mL
BENZODIAZEPINE SCREEN, URINE: NEGATIVE ng/mL
Buprenorphine, Urine: NEGATIVE ng/mL
CANNABINOIDS UR QL SCN: NEGATIVE ng/mL
Cocaine (Metab) Scrn, Ur: NEGATIVE ng/mL
Creatinine(Crt), U: 193 mg/dL (ref 20.0–300.0)
Fentanyl, Urine: NEGATIVE pg/mL
Meperidine Screen, Urine: NEGATIVE ng/mL
Methadone Screen, Urine: NEGATIVE ng/mL
OPIATE SCREEN URINE: NEGATIVE ng/mL
OXYCODONE+OXYMORPHONE UR QL SCN: NEGATIVE ng/mL
Ph of Urine: 6.1 (ref 4.5–8.9)
Phencyclidine Qn, Ur: NEGATIVE ng/mL
Propoxyphene Scrn, Ur: NEGATIVE ng/mL
SPECIFIC GRAVITY: 1.021
TRAMADOL SCREEN, URINE: NEGATIVE ng/mL

## 2016-04-03 LAB — CBC WITH DIFFERENTIAL/PLATELET
BASOS ABS: 0 10*3/uL (ref 0.0–0.2)
BASOS: 0 %
EOS (ABSOLUTE): 0.1 10*3/uL (ref 0.0–0.4)
Eos: 1 %
HEMOGLOBIN: 12.7 g/dL (ref 11.1–15.9)
Hematocrit: 36.3 % (ref 34.0–46.6)
IMMATURE GRANS (ABS): 0 10*3/uL (ref 0.0–0.1)
Immature Granulocytes: 0 %
LYMPHS: 31 %
Lymphocytes Absolute: 2.1 10*3/uL (ref 0.7–3.1)
MCH: 32.2 pg (ref 26.6–33.0)
MCHC: 35 g/dL (ref 31.5–35.7)
MCV: 92 fL (ref 79–97)
MONOCYTES: 8 %
Monocytes Absolute: 0.5 10*3/uL (ref 0.1–0.9)
Neutrophils Absolute: 4.1 10*3/uL (ref 1.4–7.0)
Neutrophils: 60 %
Platelets: 294 10*3/uL (ref 150–379)
RBC: 3.95 x10E6/uL (ref 3.77–5.28)
RDW: 13.2 % (ref 12.3–15.4)
WBC: 6.9 10*3/uL (ref 3.4–10.8)

## 2016-04-03 LAB — ABO

## 2016-04-03 LAB — HIV ANTIBODY (ROUTINE TESTING W REFLEX): HIV Screen 4th Generation wRfx: NONREACTIVE

## 2016-04-03 LAB — RH TYPE: Rh Factor: POSITIVE

## 2016-04-03 LAB — RPR: RPR Ser Ql: NONREACTIVE

## 2016-04-03 LAB — VARICELLA ZOSTER ANTIBODY, IGG: VARICELLA: 1729 {index} (ref >165)

## 2016-04-03 LAB — TSH: TSH: 0.853 u[IU]/mL (ref 0.450–4.500)

## 2016-04-03 LAB — TOXOPLASMA ANTIBODIES- IGG AND  IGM: Toxoplasma IgG Ratio: <3 IU/mL (ref 0.0–7.1)

## 2016-04-03 LAB — HEPATITIS B SURFACE ANTIGEN: Hepatitis B Surface Ag: NEGATIVE

## 2016-04-03 LAB — HEMOGLOBIN A1C
Est. average glucose Bld gHb Est-mCnc: 103 mg/dL
HEMOGLOBIN A1C: 5.2 % (ref 4.8–5.6)

## 2016-04-03 LAB — ANTIBODY SCREEN: Antibody Screen: NEGATIVE

## 2016-04-03 LAB — RUBELLA SCREEN: Rubella Antibodies, IGG: 4.41 index (ref >0.99)

## 2016-04-04 LAB — GC/CHLAMYDIA PROBE AMP
CHLAMYDIA, DNA PROBE: NEGATIVE
Neisseria gonorrhoeae by PCR: NEGATIVE

## 2016-04-04 LAB — CULTURE, OB URINE

## 2016-04-04 LAB — URINE CULTURE, OB REFLEX

## 2016-04-06 ENCOUNTER — Other Ambulatory Visit: Payer: Self-pay | Admitting: Obstetrics and Gynecology

## 2016-04-06 MED ORDER — FLUCONAZOLE 100 MG PO TABS: 100.0000 mg | ORAL_TABLET | Freq: Every day | ORAL | 0 refills | Status: DC

## 2016-04-09 ENCOUNTER — Encounter: Payer: Self-pay | Admitting: Obstetrics and Gynecology

## 2016-05-08 ENCOUNTER — Encounter: Payer: BLUE CROSS/BLUE SHIELD | Admitting: Obstetrics and Gynecology

## 2016-05-13 ENCOUNTER — Ambulatory Visit (INDEPENDENT_AMBULATORY_CARE_PROVIDER_SITE_OTHER): Payer: BLUE CROSS/BLUE SHIELD | Admitting: Obstetrics and Gynecology

## 2016-05-13 VITALS — BP 116/79 | HR 74 | Wt 219.1 lb

## 2016-05-13 DIAGNOSIS — Z3492 Encounter for supervision of normal pregnancy, unspecified, second trimester: Secondary | ICD-10-CM

## 2016-05-13 LAB — POCT URINALYSIS DIPSTICK
Bilirubin, UA: NEGATIVE
Glucose, UA: NEGATIVE
KETONES UA: NEGATIVE
Leukocytes, UA: NEGATIVE
Nitrite, UA: NEGATIVE
PROTEIN UA: NEGATIVE
RBC UA: NEGATIVE
SPEC GRAV UA: 1.01
UROBILINOGEN UA: 0.2
pH, UA: 6.5

## 2016-05-13 NOTE — Progress Notes (Signed)
NEW OB HISTORY AND PHYSICAL  SUBJECTIVE:       Darlene Escobar is a 40 y.o. [id number] female, Patient's last menstrual period was 01/31/2016 (exact date)., Estimated Date of Delivery: 11/13/16, [id number], presents today for establishment of Prenatal Care. She has no unusual complaints and complains of nausea & fatigue      Gynecologic History Patient's last menstrual period was 01/31/2016 (exact date). Normal Contraception: none Last Pap: 2016. Results were: abnormal  Obstetric History OB History  Gravida Para Term Preterm AB Living  2 1 1     1   SAB TAB Ectopic Multiple Live Births        0 1    # Outcome Date GA Lbr Len/2nd Weight Sex Delivery Anes PTL Lv  2 Current           1 Term 03/05/15 [id number] / 00:17 8 lb 8.5 oz (3.87 kg) M Vag-Spont None  LIV      Past Medical History:  Diagnosis Date  . Mild asthma     Past Surgical History:  Procedure Laterality Date  . TONSILLECTOMY      Current Outpatient Prescriptions on File Prior to Visit  Medication Sig Dispense Refill  . Cholecalciferol (VITAMIN D) 2000 UNITS CAPS Take 1 capsule (2,000 Units total) by mouth as directed. 90 capsule 2  . Prenatal Vit-Fe Fumarate-FA (PRENATAL MULTIVITAMIN) TABS tablet Take 1 tablet by mouth daily.     . Zinc 50 MG CAPS Take by mouth.    Marland Kitchen. albuterol (PROVENTIL HFA;VENTOLIN HFA) 108 (90 BASE) MCG/ACT inhaler Inhale 1 puff into the lungs every 6 (six) hours as needed for wheezing or shortness of breath.    . B Complex Vitamins (B COMPLEX 1 PO) Take by mouth.    . fluconazole (DIFLUCAN) 100 MG tablet Take 1 tablet (100 mg total) by mouth daily. (Patient not taking: Reported on 05/13/2016) 7 tablet 0   No current facility-administered medications on file prior to visit.     No Known Allergies  Social History   Social History  . Marital status: Married    Spouse name: N/A  . Number of children: N/A  . Years of education: N/A   Occupational History  . Not on file.   Social History Main  Topics  . Smoking status: Never Smoker  . Smokeless tobacco: Never Used  . Alcohol use No  . Drug use: No  . Sexual activity: Yes    Birth control/ protection: None   Other Topics Concern  . Not on file   Social History Narrative  . No narrative on file    Family History  Problem Relation Age of Onset  . Diabetes Mother   . Kidney cancer Mother   . Heart disease Father   . Cancer Neg Hx     The following portions of the patient's history were reviewed and updated as appropriate: allergies, current medications, past OB history, past medical history, past surgical history, past family history, past social history, and problem list.    OBJECTIVE: Initial Physical Exam (New OB)  GENERAL APPEARANCE: alert, well appearing, in no apparent distress, oriented to person, place and time HEAD: normocephalic, atraumatic MOUTH: mucous membranes moist, pharynx normal without lesions and dental hygiene good THYROID: no thyromegaly or masses present BREASTS: not examined LUNGS: clear to auscultation, no wheezes, rales or rhonchi, symmetric air entry HEART: regular rate and rhythm, no murmurs ABDOMEN: soft, nontender, nondistended, no abnormal masses, no epigastric pain, obese, fundus not palpable and FHT  present EXTREMITIES: no redness or tenderness in the calves or thighs SKIN: normal coloration and turgor, no rashes LYMPH NODES: no adenopathy palpable NEUROLOGIC: alert, oriented, normal speech, no focal findings or movement disorder noted  PELVIC EXAM deferred.  ASSESSMENT: Normal pregnancy Elevated BMI   PLAN: Prenatal care Desires genetic screening (Neg CFP last pregnancy) See orders

## 2016-05-13 NOTE — Progress Notes (Signed)
NOB- physical- pt is doing well, trying to exercise, otherwise is doing well

## 2016-05-13 NOTE — Patient Instructions (Signed)

## 2016-05-26 ENCOUNTER — Telehealth: Payer: Self-pay | Admitting: Obstetrics and Gynecology

## 2016-05-26 ENCOUNTER — Other Ambulatory Visit: Payer: Self-pay | Admitting: Obstetrics and Gynecology

## 2016-05-26 ENCOUNTER — Encounter: Payer: Self-pay | Admitting: Obstetrics and Gynecology

## 2016-05-26 NOTE — Telephone Encounter (Signed)
Darlene Escobar CALLED YOU BACK!!!!

## 2016-05-26 NOTE — Telephone Encounter (Signed)
-----   Message from Purcell NailsMelody N Shambley, [place] sent at 05/26/2016 11:53 AM EDT ----- Please let her know results-low risk female

## 2016-05-26 NOTE — Telephone Encounter (Signed)
Notified pt of results 

## 2016-06-09 ENCOUNTER — Other Ambulatory Visit: Payer: BLUE CROSS/BLUE SHIELD

## 2016-06-12 ENCOUNTER — Encounter: Payer: BLUE CROSS/BLUE SHIELD | Admitting: Obstetrics and Gynecology

## 2016-06-16 ENCOUNTER — Ambulatory Visit (INDEPENDENT_AMBULATORY_CARE_PROVIDER_SITE_OTHER): Payer: BLUE CROSS/BLUE SHIELD | Admitting: Obstetrics and Gynecology

## 2016-06-16 ENCOUNTER — Ambulatory Visit (INDEPENDENT_AMBULATORY_CARE_PROVIDER_SITE_OTHER): Payer: BLUE CROSS/BLUE SHIELD

## 2016-06-16 VITALS — BP 106/79 | HR 79 | Wt 221.9 lb

## 2016-06-16 DIAGNOSIS — Z3492 Encounter for supervision of normal pregnancy, unspecified, second trimester: Secondary | ICD-10-CM

## 2016-06-16 LAB — POCT URINALYSIS DIPSTICK
BILIRUBIN UA: NEGATIVE
Blood, UA: NEGATIVE
Glucose, UA: NEGATIVE
Ketones, UA: NEGATIVE
LEUKOCYTES UA: NEGATIVE
NITRITE UA: NEGATIVE
PH UA: 6
PROTEIN UA: NEGATIVE
Spec Grav, UA: 1.015
Urobilinogen, UA: 0.2

## 2016-06-16 NOTE — Progress Notes (Signed)
ROB- pt is doing well, c/o pain lower abdomen

## 2016-06-16 NOTE — Progress Notes (Signed)
ROB- doing well, u/s today reveals:  Indications:Anatomy U/S Findings:  Singleton intrauterine pregnancy is visualized with FHR at 155 BPM. Biometrics give an (U/S) Gestational age of [redacted] weeks and an (U/S) EDD of 11/10/16; this correlates with the clinically established EDD of 11/13/16.  Fetal presentation is Breech.  EFW: 256g (9oz). Placenta: Anterior, Grade 0, 4.4cm from internal os. AFI: Adequate with MVP of 4.7cm.  Anatomic survey is incomplete and normal; Gender - female  .  Spine and heels not evaluated today due to fetal position and EGA.    Ovaries are not seen. Survey of the adnexa demonstrates no adnexal masses. There is no free peritoneal fluid in the cul de sac.  Impression: 1. 19 week Viable Singleton Intrauterine pregnancy by U/S. 2. (U/S) EDD is consistent with Clinically established (LMP) EDD of 11/13/16. 3. Incomplete Anatomy Scan

## 2016-06-28 ENCOUNTER — Encounter: Payer: Self-pay | Admitting: Emergency Medicine

## 2016-06-28 ENCOUNTER — Emergency Department
Admission: EM | Admit: 2016-06-28 | Discharge: 2016-06-28 | Discharge status: Against Medical Advice | Payer: BLUE CROSS/BLUE SHIELD | Attending: Emergency Medicine | Admitting: Emergency Medicine

## 2016-06-28 DIAGNOSIS — O9989 Other specified diseases and conditions complicating pregnancy, childbirth and the puerperium: Secondary | ICD-10-CM | POA: Insufficient documentation

## 2016-06-28 DIAGNOSIS — Z79899 Other long term (current) drug therapy: Secondary | ICD-10-CM | POA: Insufficient documentation

## 2016-06-28 DIAGNOSIS — R079 Chest pain, unspecified: Secondary | ICD-10-CM | POA: Insufficient documentation

## 2016-06-28 DIAGNOSIS — J45909 Unspecified asthma, uncomplicated: Secondary | ICD-10-CM | POA: Diagnosis not present

## 2016-06-28 DIAGNOSIS — Z3A20 20 weeks gestation of pregnancy: Secondary | ICD-10-CM | POA: Insufficient documentation

## 2016-06-28 LAB — BASIC METABOLIC PANEL
ANION GAP: 8 (ref 5–15)
BUN: 9 mg/dL (ref 6–20)
CALCIUM: 8.8 mg/dL — AB (ref 8.9–10.3)
CO2: 20 mmol/L — ABNORMAL LOW (ref 22–32)
Chloride: 106 mmol/L (ref 101–111)
Creatinine, Ser: 0.62 mg/dL (ref 0.44–1.00)
GFR calc Af Amer: >60 mL/min (ref >60)
Glucose, Bld: 91 mg/dL (ref 65–99)
POTASSIUM: 3.7 mmol/L (ref 3.5–5.1)
SODIUM: 134 mmol/L — AB (ref 135–145)

## 2016-06-28 LAB — CBC
HEMATOCRIT: 34.1 % — AB (ref 35.0–47.0)
HEMOGLOBIN: 11.9 g/dL — AB (ref 12.0–16.0)
MCH: 33.2 pg (ref 26.0–34.0)
MCHC: 34.9 g/dL (ref 32.0–36.0)
MCV: 95.2 fL (ref 80.0–100.0)
Platelets: 212 10*3/uL (ref 150–440)
RBC: 3.58 MIL/uL — ABNORMAL LOW (ref 3.80–5.20)
RDW: 13 % (ref 11.5–14.5)
WBC: 8.6 10*3/uL (ref 3.6–11.0)

## 2016-06-28 LAB — TROPONIN I

## 2016-06-28 NOTE — ED Triage Notes (Signed)
C/O Left upper chest, shoulder pain / heaviness radiating to left arm making left arm feel numb.  Onset today around 1630 while driving car.  Patient is [redacted] weeks pregnant.  Denies  Abdominal pain.  EDC:  11/13/2016.  G2 P1

## 2016-06-28 NOTE — ED Provider Notes (Signed)
[facility] Emergency Department Provider Note  Time seen: 8:18 PM  I have reviewed the triage vital signs and the nursing notes.   HISTORY  Chief Complaint Chest Pain    HPI Darlene Escobar is a 40 y.o. female with a past medical history of asthma, G2 P1 [redacted] weeks pregnant who presents the emergency department for left-sided chest pain. According to the patient around 4 PM today she experienced sudden onset of left-sided chest pain where she fell like it radiated to her left arm. States it was brief and has not recurred since. Denies any pain currently. Denies any trouble breathing at any point. Denies any nausea or diaphoresis. Patient states since she is pregnant she was concerned so she came to the emergency department for evaluation.  Past Medical History:  Diagnosis Date  . Mild asthma     Patient Active Problem List   Diagnosis Date Noted  . Increased BMI 03/18/2016  . Pregnancy 03/18/2016  . Advanced maternal age in multigravida 03/18/2016  . Obesity (BMI 30-39.9) 12/31/2015  . ASCUS favor benign 08/21/2015    Past Surgical History:  Procedure Laterality Date  . TONSILLECTOMY      Prior to Admission medications   Medication Sig Start Date End Date Taking? Authorizing Provider  albuterol (PROVENTIL HFA;VENTOLIN HFA) 108 (90 BASE) MCG/ACT inhaler Inhale 1 puff into the lungs every 6 (six) hours as needed for wheezing or shortness of breath.    Historical Provider, MD  B Complex Vitamins (B COMPLEX 1 PO) Take by mouth.    Historical Provider, MD  Cholecalciferol (VITAMIN D) 2000 UNITS CAPS Take 1 capsule (2,000 Units total) by mouth as directed. 03/07/15   Melody N Shambley, CNM  fluconazole (DIFLUCAN) 100 MG tablet Take 1 tablet (100 mg total) by mouth daily. Patient not taking: Reported on 05/13/2016 04/06/16   Melody Suzan NailerN Shambley, CNM  Prenatal Vit-Fe Fumarate-FA (PRENATAL MULTIVITAMIN) TABS tablet Take 1 tablet by mouth daily.     Historical  Provider, MD  Zinc 50 MG CAPS Take by mouth.    Historical Provider, MD    No Known Allergies  Family History  Problem Relation Age of Onset  . Diabetes Mother   . Kidney cancer Mother   . Heart disease Father   . Cancer Neg Hx     Social History Social History  Substance Use Topics  . Smoking status: Never Smoker  . Smokeless tobacco: Never Used  . Alcohol use No    Review of Systems Constitutional: Negative for fever. Cardiovascular: Left upper chest pain. Now resolved. Respiratory: Negative for shortness of breath. Gastrointestinal: Negative for abdominal pain Neurological: Negative for headache 10-point ROS otherwise negative.  ____________________________________________   PHYSICAL EXAM:  VITAL SIGNS: ED Triage Vitals  Enc Vitals Group     BP 06/28/16 1811 111/69     Pulse Rate 06/28/16 1811 65     Resp 06/28/16 1811 16     Temp 06/28/16 1811 97.7 F (36.5 C)     Temp Source 06/28/16 1811 Oral     SpO2 06/28/16 1811 98 %     Weight 06/28/16 1809 221 lb (100.2 kg)     Height 06/28/16 1809 5\' 3"  (1.6 m)     Head Circumference --      Peak Flow --      Pain Score 06/28/16 1809 2     Pain Loc --      Pain Edu? --      Excl. in  GC? --     Constitutional: Alert and oriented. Well appearing and in no distress. Eyes: Normal exam ENT   Head: Normocephalic and atraumatic.   Mouth/Throat: Mucous membranes are moist. Cardiovascular: Normal rate, regular rhythm. No murmur Respiratory: Normal respiratory effort without tachypnea nor retractions. Breath sounds are clear Gastrointestinal: Soft and nontender. Gravid abdomen Musculoskeletal: Nontender with normal range of motion in all extremities.  Neurologic:  Normal speech and language. No gross focal neurologic deficits Skin:  Skin is warm, dry and intact.  Psychiatric: Mood and affect are normal. Speech and behavior are normal.   ____________________________________________    EKG  EKG reviewed  and interpreted by myself shows normal sinus rhythm at 72 bpm, narrow QRS, normal axis, normal intervals, no ST changes. Normal EKG.  ____________________________________________    INITIAL IMPRESSION / ASSESSMENT AND PLAN / ED COURSE  Pertinent labs & imaging results that were available during my care of the patient were reviewed by me and considered in my medical decision making (see chart for details).  The patient presents the emergency department with sudden onset of left-sided chest pain. States the chest pain was very brief, denies any further recurrence of the pain. Patient's labs are within normal limits. Troponin is negative. EKG is very reassuring. As the patient is chest pain-free at this time and she is pregnant I do not believe x-ray imaging is necessary. We will repeat a troponin in the emergency department. We'll use a bedside ultrasound to evaluate the pregnancy. Patient is agreeable to plan.  Patient eloped prior to repeat troponin and bedside ultrasound.  ____________________________________________   FINAL CLINICAL IMPRESSION(S) / ED DIAGNOSES  Chest pain    Minna AntisKevin Lulie Hurd, MD 06/28/16 2344

## 2016-06-28 NOTE — ED Notes (Addendum)
Pt not in the room for redraw of troponin - dr made aware.

## 2016-06-28 NOTE — ED Notes (Signed)
Pt not in the room

## 2016-07-14 ENCOUNTER — Ambulatory Visit (INDEPENDENT_AMBULATORY_CARE_PROVIDER_SITE_OTHER): Payer: BLUE CROSS/BLUE SHIELD

## 2016-07-14 DIAGNOSIS — Z3492 Encounter for supervision of normal pregnancy, unspecified, second trimester: Secondary | ICD-10-CM

## 2016-07-17 ENCOUNTER — Ambulatory Visit (INDEPENDENT_AMBULATORY_CARE_PROVIDER_SITE_OTHER): Payer: BLUE CROSS/BLUE SHIELD | Admitting: Obstetrics and Gynecology

## 2016-07-17 VITALS — BP 111/74 | HR 93 | Wt 227.7 lb

## 2016-07-17 DIAGNOSIS — Z3492 Encounter for supervision of normal pregnancy, unspecified, second trimester: Secondary | ICD-10-CM

## 2016-07-17 LAB — POCT URINALYSIS DIPSTICK
GLUCOSE UA: NEGATIVE
Ketones, UA: NEGATIVE
Leukocytes, UA: NEGATIVE
Nitrite, UA: NEGATIVE
RBC UA: NEGATIVE
SPEC GRAV UA: 1.02
Urobilinogen, UA: 0.2
pH, UA: 6

## 2016-07-17 NOTE — Progress Notes (Signed)
ROB- pt is c/o B sciatic nerve pain

## 2016-07-17 NOTE — Progress Notes (Signed)
ROB- doing well, recommend chiropractor for sciatic pain.

## 2016-07-27 ENCOUNTER — Encounter: Payer: Self-pay | Admitting: Obstetrics and Gynecology

## 2016-07-27 ENCOUNTER — Other Ambulatory Visit: Payer: Self-pay | Admitting: *Deleted

## 2016-07-27 MED ORDER — TERCONAZOLE 0.4 % VA CREA: 1.0000 | TOPICAL_CREAM | Freq: Every day | VAGINAL | 1 refills | Status: DC

## 2016-08-18 ENCOUNTER — Encounter: Payer: BC Managed Care – PPO | Admitting: Obstetrics and Gynecology

## 2016-08-19 ENCOUNTER — Ambulatory Visit (INDEPENDENT_AMBULATORY_CARE_PROVIDER_SITE_OTHER): Payer: BLUE CROSS/BLUE SHIELD | Admitting: Obstetrics and Gynecology

## 2016-08-19 ENCOUNTER — Other Ambulatory Visit: Payer: BLUE CROSS/BLUE SHIELD

## 2016-08-19 VITALS — BP 128/84 | HR 93 | Wt 231.3 lb

## 2016-08-19 DIAGNOSIS — Z131 Encounter for screening for diabetes mellitus: Secondary | ICD-10-CM

## 2016-08-19 DIAGNOSIS — Z3492 Encounter for supervision of normal pregnancy, unspecified, second trimester: Secondary | ICD-10-CM | POA: Diagnosis not present

## 2016-08-19 DIAGNOSIS — Z23 Encounter for immunization: Secondary | ICD-10-CM | POA: Diagnosis not present

## 2016-08-19 LAB — POCT URINALYSIS DIPSTICK
Bilirubin, UA: NEGATIVE
Blood, UA: NEGATIVE
Glucose, UA: NEGATIVE
KETONES UA: 40
Leukocytes, UA: NEGATIVE
Nitrite, UA: NEGATIVE
PROTEIN UA: NEGATIVE
SPEC GRAV UA: 1.015
UROBILINOGEN UA: 0.2
pH, UA: 6.5

## 2016-08-19 MED ORDER — TETANUS-DIPHTH-ACELL PERTUSSIS 5-2.5-18.5 LF-MCG/0.5 IM SUSP
0.5000 mL | Freq: Once | INTRAMUSCULAR | Status: AC
  Administered 2016-08-19: 0.5 mL via INTRAMUSCULAR

## 2016-08-19 NOTE — Patient Instructions (Signed)
Third Trimester of Pregnancy The third trimester is from week 29 through week 40 (months 7 through 9). The third trimester is a time when the unborn baby (fetus) is growing rapidly. At the end of the ninth month, the fetus is about 20 inches in length and weighs 6-10 pounds. Body changes during your third trimester Your body goes through many changes during pregnancy. The changes vary from woman to woman. During the third trimester:  Your weight will continue to increase. You can expect to gain 25-35 pounds (11-16 kg) by the end of the pregnancy.  You may begin to get stretch marks on your hips, abdomen, and breasts.  You may urinate more often because the fetus is moving lower into your pelvis and pressing on your bladder.  You may develop or continue to have heartburn. This is caused by increased hormones that slow down muscles in the digestive tract.  You may develop or continue to have constipation because increased hormones slow digestion and cause the muscles that push waste through your intestines to relax.  You may develop hemorrhoids. These are swollen veins (varicose veins) in the rectum that can itch or be painful.  You may develop swollen, bulging veins (varicose veins) in your legs.  You may have increased body aches in the pelvis, back, or thighs. This is due to weight gain and increased hormones that are relaxing your joints.  You may have changes in your hair. These can include thickening of your hair, rapid growth, and changes in texture. Some women also have hair loss during or after pregnancy, or hair that feels dry or thin. Your hair will most likely return to normal after your baby is born.  Your breasts will continue to grow and they will continue to become tender. A yellow fluid (colostrum) may leak from your breasts. This is the first milk you are producing for your baby.  Your belly button may stick out.  You may notice more swelling in your hands, face, or  ankles.  You may have increased tingling or numbness in your hands, arms, and legs. The skin on your belly may also feel numb.  You may feel short of breath because of your expanding uterus.  You may have more problems sleeping. This can be caused by the size of your belly, increased need to urinate, and an increase in your body's metabolism.  You may notice the fetus "dropping," or moving lower in your abdomen.  You may have increased vaginal discharge.  Your cervix becomes thin and soft (effaced) near your due date. What to expect at prenatal visits You will have prenatal exams every 2 weeks until week 36. Then you will have weekly prenatal exams. During a routine prenatal visit:  You will be weighed to make sure you and the fetus are growing normally.  Your blood pressure will be taken.  Your abdomen will be measured to track your baby's growth.  The fetal heartbeat will be listened to.  Any test results from the previous visit will be discussed.  You may have a cervical check near your due date to see if you have effaced. At around 36 weeks, your health care provider will check your cervix. At the same time, your health care provider will also perform a test on the secretions of the vaginal tissue. This test is to determine if a type of bacteria, Group B streptococcus, is present. Your health care provider will explain this further. Your health care provider may ask you:    What your birth plan is.  How you are feeling.  If you are feeling the baby move.  If you have had any abnormal symptoms, such as leaking fluid, bleeding, severe headaches, or abdominal cramping.  If you are using any tobacco products, including cigarettes, chewing tobacco, and electronic cigarettes.  If you have any questions. Other tests or screenings that may be performed during your third trimester include:  Blood tests that check for low iron levels (anemia).  Fetal testing to check the health,  activity level, and growth of the fetus. Testing is done if you have certain medical conditions or if there are problems during the pregnancy.  Nonstress test (NST). This test checks the health of your baby to make sure there are no signs of problems, such as the baby not getting enough oxygen. During this test, a belt is placed around your belly. The baby is made to move, and its heart rate is monitored during movement. What is false labor? False labor is a condition in which you feel small, irregular tightenings of the muscles in the womb (contractions) that eventually go away. These are called Braxton Hicks contractions. Contractions may last for hours, days, or even weeks before true labor sets in. If contractions come at regular intervals, become more frequent, increase in intensity, or become painful, you should see your health care provider. What are the signs of labor?  Abdominal cramps.  Regular contractions that start at 10 minutes apart and become stronger and more frequent with time.  Contractions that start on the top of the uterus and spread down to the lower abdomen and back.  Increased pelvic pressure and dull back pain.  A watery or bloody mucus discharge that comes from the vagina.  Leaking of amniotic fluid. This is also known as your "water breaking." It could be a slow trickle or a gush. Let your doctor know if it has a color or strange odor. If you have any of these signs, call your health care provider right away, even if it is before your due date. Follow these instructions at home: Eating and drinking  Continue to eat regular, healthy meals.  Do not eat:  Raw meat or meat spreads.  Unpasteurized milk or cheese.  Unpasteurized juice.  Store-made salad.  Refrigerated smoked seafood.  Hot dogs or deli meat, unless they are piping hot.  More than 6 ounces of albacore tuna a week.  Shark, swordfish, king mackerel, or tile fish.  Store-made salads.  Raw  sprouts, such as mung bean or alfalfa sprouts.  Take prenatal vitamins as told by your health care provider.  Take 1000 mg of calcium daily as told by your health care provider.  If you develop constipation:  Take over-the-counter or prescription medicines.  Drink enough fluid to keep your urine clear or pale yellow.  Eat foods that are high in fiber, such as fresh fruits and vegetables, whole grains, and beans.  Limit foods that are high in fat and processed sugars, such as fried and sweet foods. Activity  Exercise only as directed by your health care provider. Healthy pregnant women should aim for 2 hours and 30 minutes of moderate exercise per week. If you experience any pain or discomfort while exercising, stop.  Avoid heavy lifting.  Do not exercise in extreme heat or humidity, or at high altitudes.  Wear low-heel, comfortable shoes.  Practice good posture.  Do not travel far distances unless it is absolutely necessary and only with the approval   of your health care provider.  Wear your seat belt at all times while in a car, on a bus, or on a plane.  Take frequent breaks and rest with your legs elevated if you have leg cramps or low back pain.  Do not use hot tubs, steam rooms, or saunas.  You may continue to have sex unless your health care provider tells you otherwise. Lifestyle  Do not use any products that contain nicotine or tobacco, such as cigarettes and e-cigarettes. If you need help quitting, ask your health care provider.  Do not drink alcohol.  Do not use any medicinal herbs or unprescribed drugs. These chemicals affect the formation and growth of the baby.  If you develop varicose veins:  Wear support pantyhose or compression stockings as told by your healthcare provider.  Elevate your feet for 15 minutes, 3-4 times a day.  Wear a supportive maternity bra to help with breast tenderness. General instructions  Take over-the-counter and prescription  medicines only as told by your health care provider. There are medicines that are either safe or unsafe to take during pregnancy.  Take warm sitz baths to soothe any pain or discomfort caused by hemorrhoids. Use hemorrhoid cream or witch hazel if your health care provider approves.  Avoid cat litter boxes and soil used by cats. These carry germs that can cause birth defects in the baby. If you have a cat, ask someone to clean the litter box for you.  To prepare for the arrival of your baby:  Take prenatal classes to understand, practice, and ask questions about the labor and delivery.  Make a trial run to the hospital.  Visit the hospital and tour the maternity area.  Arrange for maternity or paternity leave through employers.  Arrange for family and friends to take care of pets while you are in the hospital.  Purchase a rear-facing car seat and make sure you know how to install it in your car.  Pack your hospital bag.  Prepare the baby's nursery. Make sure to remove all pillows and stuffed animals from the baby's crib to prevent suffocation.  Visit your dentist if you have not gone during your pregnancy. Use a soft toothbrush to brush your teeth and be gentle when you floss.  Keep all prenatal follow-up visits as told by your health care provider. This is important. Contact a health care provider if:  You are unsure if you are in labor or if your water has broken.  You become dizzy.  You have mild pelvic cramps, pelvic pressure, or nagging pain in your abdominal area.  You have lower back pain.  You have persistent nausea, vomiting, or diarrhea.  You have an unusual or bad smelling vaginal discharge.  You have pain when you urinate. Get help right away if:  You have a fever.  You are leaking fluid from your vagina.  You have spotting or bleeding from your vagina.  You have severe abdominal pain or cramping.  You have rapid weight loss or weight gain.  You have  shortness of breath with chest pain.  You notice sudden or extreme swelling of your face, hands, ankles, feet, or legs.  Your baby makes fewer than 10 movements in 2 hours.  You have severe headaches that do not go away with medicine.  You have vision changes. Summary  The third trimester is from week 29 through week 40, months 7 through 9. The third trimester is a time when the unborn baby (fetus)   is growing rapidly.  During the third trimester, your discomfort may increase as you and your baby continue to gain weight. You may have abdominal, leg, and back pain, sleeping problems, and an increased need to urinate.  During the third trimester your breasts will keep growing and they will continue to become tender. A yellow fluid (colostrum) may leak from your breasts. This is the first milk you are producing for your baby.  False labor is a condition in which you feel small, irregular tightenings of the muscles in the womb (contractions) that eventually go away. These are called Braxton Hicks contractions. Contractions may last for hours, days, or even weeks before true labor sets in.  Signs of labor can include: abdominal cramps; regular contractions that start at 10 minutes apart and become stronger and more frequent with time; watery or bloody mucus discharge that comes from the vagina; increased pelvic pressure and dull back pain; and leaking of amniotic fluid. This information is not intended to replace advice given to you by your health care provider. Make sure you discuss any questions you have with your health care provider. Document Released: 08/11/2001 Document Revised: 01/23/2016 Document Reviewed: 10/18/2012 Elsevier Interactive Patient Education  2017 [employer]  

## 2016-08-19 NOTE — Progress Notes (Signed)
ROB- glucola done, blood consent, tdap given  Pt is doing well 

## 2016-08-19 NOTE — Progress Notes (Signed)
ROB- doing well, glucola done and blood consent signed.

## 2016-08-19 NOTE — Addendum Note (Signed)
Addended by: Rosine BeatLONTZ, Damaris Abeln L on: 08/19/2016 03:42 PM   Modules accepted: Orders

## 2016-08-21 LAB — HEMOGLOBIN AND HEMATOCRIT, BLOOD
HEMOGLOBIN: 11.9 g/dL (ref 11.1–15.9)
Hematocrit: 35.5 % (ref 34.0–46.6)

## 2016-08-21 LAB — GLUCOSE, 1 HOUR GESTATIONAL: GESTATIONAL DIABETES SCREEN: 141 mg/dL — AB (ref 65–139)

## 2016-08-31 NOTE — L&D Delivery Note (Addendum)
Delivery Note   Darlene FastSelena Allsup is a 41 y.o. G2P1001 at 3033w6d Estimated Date of Delivery: 11/13/16  PRE-OPERATIVE DIAGNOSIS:  1) 5133w6d pregnancy.   POST-OPERATIVE DIAGNOSIS:  1) 6733w6d pregnancy s/p Vaginal, Spontaneous Delivery   Delivery Type: Vaginal, Spontaneous Delivery    Delivery Clinician: Doreene BurkeHOMPSON, ANNIE   Delivery Anesthesia: Epidural   Labor Complications:   none   ESTIMATED BLOOD LOSS: 150  ml    FINDINGS:   1) female infant, Apgar scores of 8 at 1 minute and 9  at 5 minutes and a birthweight of    ounces.    2) Nuchal cord: none  SPECIMENS:   PLACENTA:   Appearance: Intact    Removal: Spontaneous      Disposition:   normal  DISPOSITION:  Infant  left in stable condition in the delivery room, with L&D personnel and mother,  NARRATIVE SUMMARY: Labor course:  Ms. Darlene Escobar is a G2P1001 at 6633w6d who presented for labor management.  She progressed well in labor without pitocin.  She received epidural anesthesia and proceeded to complete dilation. She evidenced good maternal expulsive effort during the second stage. She went on to deliver a viable female infant "Alta Corningoah Riley". The placenta delivered without problems and was noted to be complete. A perineal and vaginal examination was performed. Episiotomy/Lacerations: None , small abrasion at vaginal introitus that is hemostatic not requiring suture.   Doreene Burkennie Thompson, CNM 11/12/2016 3:23 AM   I have reviewed the record and concur with patient management and plan. Ricardo Kayes, Daphine DeutscherMARTIN, MD, Evern CoreFACOG

## 2016-09-02 ENCOUNTER — Other Ambulatory Visit: Payer: BLUE CROSS/BLUE SHIELD

## 2016-09-02 DIAGNOSIS — R7309 Other abnormal glucose: Secondary | ICD-10-CM

## 2016-09-03 ENCOUNTER — Encounter: Payer: Self-pay | Admitting: Obstetrics and Gynecology

## 2016-09-03 LAB — GESTATIONAL GLUCOSE TOLERANCE
GLUCOSE 1 HOUR GTT: 163 mg/dL (ref 65–179)
GLUCOSE 3 HOUR GTT: 110 mg/dL (ref 65–139)
GLUCOSE FASTING: 85 mg/dL (ref 65–94)
Glucose, GTT - 2 Hour: 123 mg/dL (ref 65–154)

## 2016-09-04 ENCOUNTER — Encounter: Payer: BLUE CROSS/BLUE SHIELD | Admitting: Obstetrics and Gynecology

## 2016-09-09 ENCOUNTER — Ambulatory Visit (INDEPENDENT_AMBULATORY_CARE_PROVIDER_SITE_OTHER): Payer: BLUE CROSS/BLUE SHIELD | Admitting: Obstetrics and Gynecology

## 2016-09-09 VITALS — BP 121/70 | HR 95 | Wt 230.7 lb

## 2016-09-09 DIAGNOSIS — Z3493 Encounter for supervision of normal pregnancy, unspecified, third trimester: Secondary | ICD-10-CM

## 2016-09-09 LAB — POCT URINALYSIS DIPSTICK
Bilirubin, UA: NEGATIVE
Glucose, UA: NEGATIVE
KETONES UA: 15
LEUKOCYTES UA: NEGATIVE
NITRITE UA: NEGATIVE
PH UA: 6.5
PROTEIN UA: NEGATIVE
RBC UA: NEGATIVE
Spec Grav, UA: 1.01
Urobilinogen, UA: 0.2

## 2016-09-09 NOTE — Progress Notes (Signed)
ROB-discussed BC and breastfeeding, open to epidural if needed this time.

## 2016-09-09 NOTE — Progress Notes (Signed)
ROB- pt is doing well, denies any complaints 

## 2016-09-18 ENCOUNTER — Encounter: Payer: BLUE CROSS/BLUE SHIELD | Admitting: Obstetrics and Gynecology

## 2016-09-23 ENCOUNTER — Inpatient Hospital Stay
Admission: EM | Admit: 2016-09-23 | Discharge: 2016-09-24 | Disposition: A | Discharge status: Home / Self Care | Payer: BLUE CROSS/BLUE SHIELD | Attending: Obstetrics and Gynecology | Admitting: Obstetrics and Gynecology

## 2016-09-23 DIAGNOSIS — Z3A32 32 weeks gestation of pregnancy: Secondary | ICD-10-CM | POA: Diagnosis not present

## 2016-09-23 DIAGNOSIS — R109 Unspecified abdominal pain: Secondary | ICD-10-CM | POA: Insufficient documentation

## 2016-09-23 DIAGNOSIS — O26893 Other specified pregnancy related conditions, third trimester: Secondary | ICD-10-CM | POA: Diagnosis not present

## 2016-09-23 MED ORDER — ACETAMINOPHEN 325 MG PO TABS
650.0000 mg | ORAL_TABLET | Freq: Once | ORAL | Status: AC | PRN
  Administered 2016-09-23: 650 mg via ORAL
  Filled 2016-09-23: qty 2

## 2016-09-23 MED ORDER — MAGNESIUM HYDROXIDE 400 MG/5ML PO SUSP
15.0000 mL | Freq: Once | ORAL | Status: AC
  Administered 2016-09-23: 15 mL via ORAL
  Filled 2016-09-23: qty 30

## 2016-09-23 NOTE — Progress Notes (Signed)
Melody LanarkShambley, CNM called to gave patient update. Baby continue to be category I, accelerations present, great variability. Patient now denies pain after taking Tylenol 650mg  and reports no longer feeling acid reflux. Verbal orders from Melody to discharge patient home.

## 2016-09-23 NOTE — Progress Notes (Signed)
[name], CNM a call at this time to give patient report/update. Verbal orders to patient on the monitor for another hour. Will call her back with an update.

## 2016-09-23 NOTE — Progress Notes (Signed)
Went to patient's room to update on plan of care. Patint verbalized agreement. Patient would like to have medication for acid reflux and for lower abdomen pain. Called Melody Pine [place]ambley, [place] to give her the update. Gave verbal orders for Milk of Mg. Oral 15mL and for Tylenol 650mg . Verbal order with read-back.

## 2016-09-23 NOTE — Progress Notes (Signed)
Patient came in tonight from home reporting that she fell at home around 1915. She states that she was in the room holding her 1918 month old infant when she felt her foot caught on the heater on their floor. She mentioned she forgot the heater was there and began to fall down towards her left side. Does not remember weather she fell on her abdomen or left hip. She is reporting pain 3 out of 10 in her lower abdomen since the fall. She has positive fetal movement. Denies vaginal bleeding and vaginal discharge. Patient currently on the monitor and being assessed.

## 2016-09-24 DIAGNOSIS — O26893 Other specified pregnancy related conditions, third trimester: Secondary | ICD-10-CM | POA: Diagnosis not present

## 2016-09-25 ENCOUNTER — Ambulatory Visit (INDEPENDENT_AMBULATORY_CARE_PROVIDER_SITE_OTHER): Payer: BLUE CROSS/BLUE SHIELD | Admitting: Obstetrics and Gynecology

## 2016-09-25 VITALS — BP 114/68 | HR 96 | Wt 231.7 lb

## 2016-09-25 DIAGNOSIS — Z3493 Encounter for supervision of normal pregnancy, unspecified, third trimester: Secondary | ICD-10-CM

## 2016-09-25 NOTE — Progress Notes (Signed)
ROB- pt states her R hip is hurting- is taking tylenol

## 2016-09-25 NOTE — Progress Notes (Signed)
ROB-doing good except hip pain from fall.

## 2016-10-07 ENCOUNTER — Ambulatory Visit (INDEPENDENT_AMBULATORY_CARE_PROVIDER_SITE_OTHER): Payer: BLUE CROSS/BLUE SHIELD | Admitting: Obstetrics and Gynecology

## 2016-10-07 VITALS — BP 115/69 | HR 88 | Wt 237.1 lb

## 2016-10-07 DIAGNOSIS — Z3493 Encounter for supervision of normal pregnancy, unspecified, third trimester: Secondary | ICD-10-CM

## 2016-10-07 LAB — POCT URINALYSIS DIPSTICK
KETONES UA: NEGATIVE
Leukocytes, UA: NEGATIVE
Nitrite, UA: NEGATIVE
RBC UA: NEGATIVE
SPEC GRAV UA: 1.015
Urobilinogen, UA: 0.2
pH, UA: 7

## 2016-10-07 NOTE — Progress Notes (Signed)
ROB- pt has had 6lb wt increase, had a bad headache yesterday, states she doesn't notice any swelling

## 2016-10-07 NOTE — Progress Notes (Signed)
ROB-doing better since fall, discussed trying progestin only pill. Cultures next visit

## 2016-10-16 ENCOUNTER — Ambulatory Visit (INDEPENDENT_AMBULATORY_CARE_PROVIDER_SITE_OTHER): Payer: BLUE CROSS/BLUE SHIELD | Admitting: Obstetrics and Gynecology

## 2016-10-16 ENCOUNTER — Encounter: Payer: Self-pay | Admitting: Obstetrics and Gynecology

## 2016-10-16 VITALS — BP 118/84 | HR 87 | Wt 234.5 lb

## 2016-10-16 DIAGNOSIS — Z3493 Encounter for supervision of normal pregnancy, unspecified, third trimester: Secondary | ICD-10-CM | POA: Diagnosis not present

## 2016-10-16 DIAGNOSIS — Z113 Encounter for screening for infections with a predominantly sexual mode of transmission: Secondary | ICD-10-CM

## 2016-10-16 DIAGNOSIS — Z3685 Encounter for antenatal screening for Streptococcus B: Secondary | ICD-10-CM

## 2016-10-16 LAB — POCT URINALYSIS DIPSTICK
BILIRUBIN UA: NEGATIVE
Blood, UA: NEGATIVE
Ketones, UA: NEGATIVE
LEUKOCYTES UA: NEGATIVE
NITRITE UA: NEGATIVE
Spec Grav, UA: 1.02
UROBILINOGEN UA: 0.2
pH, UA: 6

## 2016-10-16 NOTE — Progress Notes (Signed)
OB WORK IN- "pt thinks she is leaking fluid" x 5 days

## 2016-10-16 NOTE — Progress Notes (Signed)
ROB- reports scant clear fluid that wets entire crotch of panties a few times daily. Sterile speculum exam negative with NTZ & Fern negative, reassured. Cultures obtained.

## 2016-10-18 ENCOUNTER — Other Ambulatory Visit: Payer: Self-pay | Admitting: Obstetrics and Gynecology

## 2016-10-18 LAB — STREP GP B NAA: Strep Gp B NAA: POSITIVE — AB

## 2016-10-20 LAB — GC/CHLAMYDIA PROBE AMP
CHLAMYDIA, DNA PROBE: NEGATIVE
Neisseria gonorrhoeae by PCR: NEGATIVE

## 2016-10-21 ENCOUNTER — Encounter: Payer: BLUE CROSS/BLUE SHIELD | Admitting: Obstetrics and Gynecology

## 2016-10-28 ENCOUNTER — Ambulatory Visit (INDEPENDENT_AMBULATORY_CARE_PROVIDER_SITE_OTHER): Payer: BLUE CROSS/BLUE SHIELD | Admitting: Obstetrics and Gynecology

## 2016-10-28 VITALS — BP 115/80 | HR 93 | Wt 240.3 lb

## 2016-10-28 DIAGNOSIS — Z3493 Encounter for supervision of normal pregnancy, unspecified, third trimester: Secondary | ICD-10-CM

## 2016-10-28 LAB — POCT URINALYSIS DIPSTICK
BILIRUBIN UA: NEGATIVE
Blood, UA: NEGATIVE
Ketones, UA: NEGATIVE
Leukocytes, UA: NEGATIVE
NITRITE UA: NEGATIVE
SPEC GRAV UA: 1.015
Urobilinogen, UA: 0.2
pH, UA: 6

## 2016-10-28 NOTE — Progress Notes (Signed)
ROB- reviewed GBS+, labor precautions discussed

## 2016-10-28 NOTE — Progress Notes (Signed)
ROB- pt is doing well, states she is having some lower abd cramping

## 2016-11-04 ENCOUNTER — Ambulatory Visit (INDEPENDENT_AMBULATORY_CARE_PROVIDER_SITE_OTHER): Payer: BLUE CROSS/BLUE SHIELD | Admitting: Obstetrics and Gynecology

## 2016-11-04 VITALS — BP 120/77 | HR 88 | Wt 241.6 lb

## 2016-11-04 DIAGNOSIS — Z3493 Encounter for supervision of normal pregnancy, unspecified, third trimester: Secondary | ICD-10-CM

## 2016-11-04 LAB — POCT URINALYSIS DIPSTICK
Bilirubin, UA: NEGATIVE
GLUCOSE UA: NEGATIVE
Ketones, UA: NEGATIVE
Leukocytes, UA: NEGATIVE
NITRITE UA: NEGATIVE
Protein, UA: NEGATIVE
RBC UA: NEGATIVE
Spec Grav, UA: 1.015
UROBILINOGEN UA: 0.2
pH, UA: 6

## 2016-11-04 NOTE — Progress Notes (Signed)
ROB- doing well, labor precautions discussed.  

## 2016-11-04 NOTE — Progress Notes (Signed)
ROB- pt is having some pelvic pressure 

## 2016-11-11 ENCOUNTER — Inpatient Hospital Stay
Admission: EM | Admit: 2016-11-11 | Discharge: 2016-11-14 | DRG: 775 | Disposition: A | Discharge status: Home / Self Care | Payer: BLUE CROSS/BLUE SHIELD | Attending: Obstetrics and Gynecology | Admitting: Obstetrics and Gynecology

## 2016-11-11 ENCOUNTER — Encounter: Payer: BLUE CROSS/BLUE SHIELD | Admitting: Obstetrics and Gynecology

## 2016-11-11 ENCOUNTER — Observation Stay (HOSPITAL_BASED_OUTPATIENT_CLINIC_OR_DEPARTMENT_OTHER)
Admission: EM | Admit: 2016-11-11 | Discharge: 2016-11-11 | Disposition: A | Discharge status: Home / Self Care | Payer: BLUE CROSS/BLUE SHIELD | Source: Home / Self Care | Admitting: Obstetrics and Gynecology

## 2016-11-11 DIAGNOSIS — O9952 Diseases of the respiratory system complicating childbirth: Secondary | ICD-10-CM | POA: Diagnosis present

## 2016-11-11 DIAGNOSIS — Z3A39 39 weeks gestation of pregnancy: Secondary | ICD-10-CM

## 2016-11-11 DIAGNOSIS — O479 False labor, unspecified: Secondary | ICD-10-CM | POA: Diagnosis present

## 2016-11-11 DIAGNOSIS — O99824 Streptococcus B carrier state complicating childbirth: Principal | ICD-10-CM | POA: Diagnosis present

## 2016-11-11 DIAGNOSIS — J45909 Unspecified asthma, uncomplicated: Secondary | ICD-10-CM | POA: Diagnosis present

## 2016-11-11 MED ORDER — LACTATED RINGERS IV SOLN: 500.0000 mL | INTRAVENOUS | Status: DC | PRN

## 2016-11-11 MED ORDER — OXYTOCIN BOLUS FROM INFUSION: 500.0000 mL | Freq: Once | INTRAVENOUS | Status: DC

## 2016-11-11 MED ORDER — OXYTOCIN 40 UNITS IN LACTATED RINGERS INFUSION - SIMPLE MED
INTRAVENOUS | Status: AC
  Filled 2016-11-11: qty 1000

## 2016-11-11 MED ORDER — ACETAMINOPHEN 325 MG PO TABS: 650.0000 mg | ORAL_TABLET | ORAL | Status: DC | PRN

## 2016-11-11 MED ORDER — BUTORPHANOL TARTRATE 1 MG/ML IJ SOLN
INTRAMUSCULAR | Status: AC
  Administered 2016-11-11: 1 mg
  Filled 2016-11-11: qty 1

## 2016-11-11 MED ORDER — LACTATED RINGERS IV SOLN
INTRAVENOUS | Status: DC
  Administered 2016-11-11: via INTRAVENOUS

## 2016-11-11 MED ORDER — MISOPROSTOL 200 MCG PO TABS
ORAL_TABLET | ORAL | Status: AC
  Filled 2016-11-11: qty 4

## 2016-11-11 MED ORDER — SODIUM CHLORIDE 0.9 % IV SOLN
2.0000 g | Freq: Once | INTRAVENOUS | Status: AC
  Administered 2016-11-12: 2 g via INTRAVENOUS
  Filled 2016-11-11: qty 2000

## 2016-11-11 MED ORDER — OXYTOCIN 10 UNIT/ML IJ SOLN
INTRAMUSCULAR | Status: AC
  Filled 2016-11-11: qty 2

## 2016-11-11 MED ORDER — LIDOCAINE HCL (PF) 1 % IJ SOLN
INTRAMUSCULAR | Status: AC
  Filled 2016-11-11: qty 30

## 2016-11-11 MED ORDER — ONDANSETRON HCL 4 MG/2ML IJ SOLN: 4.0000 mg | Freq: Four times a day (QID) | INTRAMUSCULAR | Status: DC | PRN

## 2016-11-11 MED ORDER — AMMONIA AROMATIC IN INHA
RESPIRATORY_TRACT | Status: AC
  Filled 2016-11-11: qty 10

## 2016-11-11 MED ORDER — SOD CITRATE-CITRIC ACID 500-334 MG/5ML PO SOLN
30.0000 mL | ORAL | Status: DC | PRN
  Filled 2016-11-11: qty 30

## 2016-11-11 MED ORDER — OXYTOCIN 40 UNITS IN LACTATED RINGERS INFUSION - SIMPLE MED: 2.5000 [IU]/h | INTRAVENOUS | Status: DC

## 2016-11-11 MED ORDER — LIDOCAINE HCL (PF) 1 % IJ SOLN: 30.0000 mL | INTRAMUSCULAR | Status: DC | PRN

## 2016-11-11 MED ORDER — FENTANYL CITRATE (PF) 100 MCG/2ML IJ SOLN: 50.0000 ug | INTRAMUSCULAR | Status: DC | PRN

## 2016-11-11 NOTE — OB Triage Note (Signed)
Ms. Darlene Escobar here with c/o ctx that have been occurring for last 3 days, more consistent this AM, timed them at every 15 minutes since 0600. Denies bleeding, LOF, reports positive fetal movement. Denies headache at this time, reports headache yesterday that went away without medication. Pt reports slight swelling in feet and ankles. Reflexes 1+, no clonus, no pitting edema

## 2016-11-11 NOTE — Final Progress Note (Signed)
See Discharge summary.  Doreene BurkeAnnie Briteny Fulghum, CNM

## 2016-11-11 NOTE — Discharge Instructions (Signed)

## 2016-11-11 NOTE — Discharge Summary (Signed)
   L&D OB Triage Note  SUBJECTIVE Darlene Escobar is a 41 y.o. 632P1001 female at 711w5d, EDD Estimated Date of Delivery: 11/13/16 who presented to triage with complaints of contractions every 15 minutes. Denies ROM and vaginal bleeding. Endorses positive fetal movement.   Obstetric History   G2   P1   T1   P0   A0   L1    SAB0   TAB0   Ectopic0   Multiple0   Live Births1     # Outcome Date GA Lbr Len/2nd Weight Sex Delivery Anes PTL Lv  2 Current           1 Term 03/05/15 5537w4d / 00:17 8 lb 8.5 oz (3.87 kg) M Vag-Spont None  LIV     Apgar1:  8                Apgar5: 9      Prescriptions Prior to Admission  Medication Sig Dispense Refill Last Dose  . albuterol (PROVENTIL HFA;VENTOLIN HFA) 108 (90 BASE) MCG/ACT inhaler Inhale 1 puff into the lungs every 6 (six) hours as needed for wheezing or shortness of breath.   Taking  . B Complex Vitamins (B COMPLEX 1 PO) Take by mouth.   Taking  . Cholecalciferol (VITAMIN D) 2000 UNITS CAPS Take 1 capsule (2,000 Units total) by mouth as directed. 90 capsule 2 Taking  . Prenatal Vit-Fe Fumarate-FA (PRENATAL MULTIVITAMIN) TABS tablet Take 1 tablet by mouth daily.    Taking  . Zinc 50 MG CAPS Take by mouth.   Taking  . fluconazole (DIFLUCAN) 100 MG tablet Take 1 tablet (100 mg total) by mouth daily. (Patient not taking: Reported on 09/09/2016) 7 tablet 0 Not Taking  . terconazole (TERAZOL 7) 0.4 % vaginal cream Place 1 applicator vaginally at bedtime. Use as directed (Patient not taking: Reported on 09/09/2016) 45 g 1 Not Taking     OBJECTIVE  Nursing Evaluation:   BP 117/84   Pulse 79   Temp 97.9 F (36.6 C) (Oral)   Resp 16   Ht 5\' 3"  (1.6 m)   Wt 241 lb (109.3 kg)   LMP 01/31/2016 (Exact Date)   BMI 42.69 kg/m      NST was performed and has been reviewed by me.  NST INTERPRETATION: Category I  Mode: External Baseline Rate (A): 135 bpm Variability: Moderate Accelerations: 15 x 15 Decelerations: None     Contraction Frequency  (min): irritability  ASSESSMENT Impression:  1. Pregnancy:  G2P1001 at 571w5d , EDD Estimated Date of Delivery: 11/13/16 2.  reactive  PLAN 1. Reassurance given 2. Discharge home with precautions to return to L&D or call the office if:  increased leakage or fluid, decreased fetal movement or labor  3. Continue routine prenatal care. Follow up at next scheduled appointment in the office.   Doreene BurkeAnnie Thompson, CNM  I have reviewed the record and concur with patient management and plan. Tmya Wigington, Daphine DeutscherMARTIN, MD, Evern CoreFACOG

## 2016-11-11 NOTE — OB Triage Note (Signed)
Patient presents to Labor and Delivery with complaints of contractions 3-5 minutes apart and rating pain 9/10.  Patient denies vaginal bleeding, leaking of fluid and decreased fetal movement. EFM applied and explained Plan to monitor fetal and maternal well-being and assess for labor.

## 2016-11-11 NOTE — H&P (Signed)
 @LOGO @  History and Physical   HPI  Darlene Escobar is a 41 y.o. G2P1001 at 5174w5d Estimated Date of Delivery: 11/13/16 who is being admitted for labor management. She presented at 2130 for labor assessment SVE 3cm. Minimal change from previous exam earlier this morning . Reactive NST. Pt ambulated x 2 hrs. Called to room for SROM 2324. Clear fluid visualized SVE 5cm. Admit for active labor.  OB History  Obstetric History   G2   P1   T1   P0   A0   L1    SAB0   TAB0   Ectopic0   Multiple0   Live Births1     # Outcome Date GA Lbr Len/2nd Weight Sex Delivery Anes PTL Lv  2 Current           1 Term 03/05/15 5486w4d / 00:17 8 lb 8.5 oz (3.87 kg) M Vag-Spont None  LIV     Apgar1:  8                Apgar5: 9      PROBLEM LIST  Pregnancy complications or risks: Patient Active Problem List   Diagnosis Date Noted  . False labor 11/11/2016  . Increased BMI 03/18/2016  . Pregnancy 03/18/2016  . Advanced maternal age in multigravida 03/18/2016  . Obesity (BMI 30-39.9) 12/31/2015  . ASCUS favor benign 08/21/2015    Prenatal labs and studies: ABO, Rh: A/Positive/-- (08/02 1434) Antibody: Negative (08/02 1434) Rubella: 4.41 (08/02 1434) RPR: Non Reactive (08/02 1434)  HBsAg: Negative (08/02 1434)  HIV: Non Reactive (08/02 1434)  ZOX:WRUEAVWUGBS:Positive (02/16 1340) 1 hr Glucola  141, 3 hr Glucola negative Genetic screening normal Anatomy US normal  Prenatal Transfer Tool  Maternal Diabetes: No Genetic Screening: Normal Maternal Ultrasounds/Referrals: Normal Fetal Ultrasounds or other Referrals:  None Maternal Substance Abuse:  No Significant Maternal Medications:  None Significant Maternal Lab Results: Lab values include: Group B Strep positive   Past Medical History:  Diagnosis Date  . Mild asthma      Past Surgical History:  Procedure Laterality Date  . TONSILLECTOMY       Medications    Current Discharge Medication List    CONTINUE these medications which have NOT  CHANGED   Details  albuterol (PROVENTIL HFA;VENTOLIN HFA) 108 (90 BASE) MCG/ACT inhaler Inhale 1 puff into the lungs every 6 (six) hours as needed for wheezing or shortness of breath.    B Complex Vitamins (B COMPLEX 1 PO) Take by mouth.    Cholecalciferol (VITAMIN D) 2000 UNITS CAPS Take 1 capsule (2,000 Units total) by mouth as directed. Qty: 90 capsule, Refills: 2    Prenatal Vit-Fe Fumarate-FA (PRENATAL MULTIVITAMIN) TABS tablet Take 1 tablet by mouth daily.     Zinc 50 MG CAPS Take by mouth.         Allergies  Patient has no known allergies.  Review of Systems  Pertinent items noted in HPI and remainder of comprehensive ROS otherwise negative.  Physical Exam  BP 119/79   Pulse 87   Temp 98.3 F (36.8 C) (Oral)   Resp 18   LMP 01/31/2016 (Exact Date)   Lungs:  CTA B Cardio: RRR without M/R/G Abd: Soft, gravid, NT Presentation: cephalic EXT: No C/C/ 1+ Edema DTRs: 2+ B CERVIX: 5:100%: -2: mid position: soft  See Prenatal records for more detailed PE.     FHR:  Baseline: 150 bpm, Variability: Good {> 6 bpm), Accelerations: Reactive and Decelerations: Early  Toco: Uterine  Contractions: Frequency: Every 3-5 minutes   Test Results  No results found for this or any previous visit (from the past 24 hour(s)). Group B Strep positive  Assessment   G2P1001 at [redacted]w[redacted]d Estimated Date of Delivery: 11/13/16  The fetus is reassuring.  Patient Active Problem List   Diagnosis Date Noted  . False labor 11/11/2016  . Increased BMI 03/18/2016  . Pregnancy 03/18/2016  . Advanced maternal age in multigravida 03/18/2016  . Obesity (BMI 30-39.9) 12/31/2015  . ASCUS favor benign 08/21/2015    Plan  1. Admit to L&D for expectant management 2. EFM:-- Category 1 3. Epidural if desired. 4. Admission labs  5. Antibiotics for GBS treatment  Doreene Burke, CNM 11/11/2016 11:37 PM   I have reviewed the record and concur with patient management and  plan. Teghan Philbin, Daphine Deutscher, MD, Evern Core

## 2016-11-12 ENCOUNTER — Observation Stay: Payer: BLUE CROSS/BLUE SHIELD | Admitting: Anesthesiology

## 2016-11-12 ENCOUNTER — Encounter: Payer: Self-pay | Admitting: Anesthesiology

## 2016-11-12 ENCOUNTER — Encounter: Payer: BLUE CROSS/BLUE SHIELD | Admitting: Certified Nurse Midwife

## 2016-11-12 DIAGNOSIS — J45909 Unspecified asthma, uncomplicated: Secondary | ICD-10-CM | POA: Diagnosis present

## 2016-11-12 DIAGNOSIS — Z3A39 39 weeks gestation of pregnancy: Secondary | ICD-10-CM

## 2016-11-12 DIAGNOSIS — O9952 Diseases of the respiratory system complicating childbirth: Secondary | ICD-10-CM | POA: Diagnosis present

## 2016-11-12 DIAGNOSIS — O99824 Streptococcus B carrier state complicating childbirth: Secondary | ICD-10-CM | POA: Diagnosis not present

## 2016-11-12 DIAGNOSIS — Z3483 Encounter for supervision of other normal pregnancy, third trimester: Secondary | ICD-10-CM | POA: Diagnosis present

## 2016-11-12 LAB — CBC
HCT: 34.4 % — ABNORMAL LOW (ref 35.0–47.0)
HEMATOCRIT: 32.1 % — AB (ref 35.0–47.0)
HEMOGLOBIN: 11.1 g/dL — AB (ref 12.0–16.0)
Hemoglobin: 12 g/dL (ref 12.0–16.0)
MCH: 33.2 pg (ref 26.0–34.0)
MCH: 34 pg (ref 26.0–34.0)
MCHC: 34.4 g/dL (ref 32.0–36.0)
MCHC: 35 g/dL (ref 32.0–36.0)
MCV: 96.4 fL (ref 80.0–100.0)
MCV: 97 fL (ref 80.0–100.0)
Platelets: 153 10*3/uL (ref 150–440)
Platelets: 201 10*3/uL (ref 150–440)
RBC: 3.33 MIL/uL — ABNORMAL LOW (ref 3.80–5.20)
RBC: 3.54 MIL/uL — AB (ref 3.80–5.20)
RDW: 13.6 % (ref 11.5–14.5)
RDW: 14.1 % (ref 11.5–14.5)
WBC: 14.5 10*3/uL — AB (ref 3.6–11.0)
WBC: 16 10*3/uL — AB (ref 3.6–11.0)

## 2016-11-12 LAB — TYPE AND SCREEN
ABO/RH(D): A POS
Antibody Screen: NEGATIVE

## 2016-11-12 MED ORDER — LIDOCAINE HCL (PF) 1 % IJ SOLN
INTRAMUSCULAR | Status: DC | PRN
  Administered 2016-11-12: 3 mL

## 2016-11-12 MED ORDER — PRENATAL MULTIVITAMIN CH
1.0000 | ORAL_TABLET | Freq: Every day | ORAL | Status: DC
  Administered 2016-11-12 – 2016-11-14 (×3): 1 via ORAL
  Filled 2016-11-12 (×3): qty 1

## 2016-11-12 MED ORDER — OXYCODONE HCL 5 MG PO TABS: 10.0000 mg | ORAL_TABLET | ORAL | Status: DC | PRN

## 2016-11-12 MED ORDER — PHENYLEPHRINE 40 MCG/ML (10ML) SYRINGE FOR IV PUSH (FOR BLOOD PRESSURE SUPPORT)
80.0000 ug | PREFILLED_SYRINGE | INTRAVENOUS | Status: DC | PRN
  Filled 2016-11-12: qty 5

## 2016-11-12 MED ORDER — SIMETHICONE 80 MG PO CHEW
80.0000 mg | CHEWABLE_TABLET | ORAL | Status: DC | PRN
Start: 2016-11-12 — End: 2016-11-14

## 2016-11-12 MED ORDER — DIPHENHYDRAMINE HCL 25 MG PO CAPS: 25.0000 mg | ORAL_CAPSULE | Freq: Four times a day (QID) | ORAL | Status: DC | PRN

## 2016-11-12 MED ORDER — LIDOCAINE HCL (PF) 1 % IJ SOLN: INTRAMUSCULAR | Status: DC | PRN

## 2016-11-12 MED ORDER — OXYCODONE HCL 5 MG PO TABS
5.0000 mg | ORAL_TABLET | ORAL | Status: DC | PRN
  Administered 2016-11-13 – 2016-11-14 (×3): 5 mg via ORAL
  Filled 2016-11-12 (×3): qty 1

## 2016-11-12 MED ORDER — FENTANYL 2.5 MCG/ML W/ROPIVACAINE 0.2% IN NS 100 ML EPIDURAL INFUSION (ARMC-ANES)
EPIDURAL | Status: AC
  Filled 2016-11-12: qty 100

## 2016-11-12 MED ORDER — EPHEDRINE 5 MG/ML INJ
10.0000 mg | INTRAVENOUS | Status: DC | PRN
  Filled 2016-11-12: qty 2

## 2016-11-12 MED ORDER — ZOLPIDEM TARTRATE 5 MG PO TABS: 5.0000 mg | ORAL_TABLET | Freq: Every evening | ORAL | Status: DC | PRN

## 2016-11-12 MED ORDER — FERROUS SULFATE 325 (65 FE) MG PO TABS
325.0000 mg | ORAL_TABLET | Freq: Every day | ORAL | Status: DC
  Administered 2016-11-12 – 2016-11-14 (×3): 325 mg via ORAL
  Filled 2016-11-12 (×3): qty 1

## 2016-11-12 MED ORDER — ONDANSETRON HCL 4 MG PO TABS: 4.0000 mg | ORAL_TABLET | ORAL | Status: DC | PRN

## 2016-11-12 MED ORDER — BUPIVACAINE HCL (PF) 0.25 % IJ SOLN
INTRAMUSCULAR | Status: DC | PRN
  Administered 2016-11-12: 10 mL via EPIDURAL

## 2016-11-12 MED ORDER — DOCUSATE SODIUM 100 MG PO CAPS
100.0000 mg | ORAL_CAPSULE | Freq: Two times a day (BID) | ORAL | Status: DC
  Administered 2016-11-13 – 2016-11-14 (×4): 100 mg via ORAL
  Filled 2016-11-12 (×4): qty 1

## 2016-11-12 MED ORDER — LIDOCAINE-EPINEPHRINE (PF) 1.5 %-1:200000 IJ SOLN
INTRAMUSCULAR | Status: DC | PRN
  Administered 2016-11-12: 3 mL via PERINEURAL

## 2016-11-12 MED ORDER — BENZOCAINE-MENTHOL 20-0.5 % EX AERO: 1.0000 "application " | INHALATION_SPRAY | CUTANEOUS | Status: DC | PRN

## 2016-11-12 MED ORDER — LACTATED RINGERS IV SOLN: 500.0000 mL | Freq: Once | INTRAVENOUS | Status: DC

## 2016-11-12 MED ORDER — FENTANYL 2.5 MCG/ML W/ROPIVACAINE 0.2% IN NS 100 ML EPIDURAL INFUSION (ARMC-ANES)
10.0000 mL/h | EPIDURAL | Status: DC
  Administered 2016-11-12: 10 mL/h via EPIDURAL

## 2016-11-12 MED ORDER — ONDANSETRON HCL 4 MG/2ML IJ SOLN: 4.0000 mg | INTRAMUSCULAR | Status: DC | PRN

## 2016-11-12 MED ORDER — WITCH HAZEL-GLYCERIN EX PADS: 1.0000 "application " | MEDICATED_PAD | CUTANEOUS | Status: DC | PRN

## 2016-11-12 MED ORDER — IBUPROFEN 600 MG PO TABS
600.0000 mg | ORAL_TABLET | Freq: Four times a day (QID) | ORAL | Status: DC
  Administered 2016-11-12 – 2016-11-14 (×9): 600 mg via ORAL
  Filled 2016-11-12 (×9): qty 1

## 2016-11-12 MED ORDER — DIBUCAINE 1 % RE OINT: 1.0000 "application " | TOPICAL_OINTMENT | RECTAL | Status: DC | PRN

## 2016-11-12 MED ORDER — TETANUS-DIPHTH-ACELL PERTUSSIS 5-2.5-18.5 LF-MCG/0.5 IM SUSP: 0.5000 mL | Freq: Once | INTRAMUSCULAR | Status: DC

## 2016-11-12 MED ORDER — DIPHENHYDRAMINE HCL 50 MG/ML IJ SOLN: 12.5000 mg | INTRAMUSCULAR | Status: DC | PRN

## 2016-11-12 MED ORDER — COCONUT OIL OIL
1.0000 "application " | TOPICAL_OIL | Status: DC | PRN
  Administered 2016-11-13: 1 via TOPICAL
  Filled 2016-11-12: qty 120

## 2016-11-12 NOTE — Anesthesia Preprocedure Evaluation (Signed)
Anesthesia Evaluation  Patient identified by MRN, date of birth, ID band Patient awake    Reviewed: Allergy & Precautions, NPO status , Patient's Chart, lab work & pertinent test results  Airway Mallampati: II       Dental no notable dental hx.    Pulmonary asthma ,    Pulmonary exam normal        Cardiovascular negative cardio ROS Normal cardiovascular exam     Neuro/Psych negative neurological ROS  negative psych ROS   GI/Hepatic negative GI ROS, Neg liver ROS,   Endo/Other  negative endocrine ROS  Renal/GU negative Renal ROS  negative genitourinary   Musculoskeletal negative musculoskeletal ROS (+)   Abdominal Normal abdominal exam  (+)   Peds negative pediatric ROS (+)  Hematology negative hematology ROS (+)   Anesthesia Other Findings   Reproductive/Obstetrics (+) Pregnancy                             Anesthesia Physical Anesthesia Plan  ASA: II  Anesthesia Plan: Epidural   Post-op Pain Management:    Induction:   Airway Management Planned: Natural Airway  Additional Equipment:   Intra-op Plan:   Post-operative Plan:   Informed Consent: I have reviewed the patients History and Physical, chart, labs and discussed the procedure including the risks, benefits and alternatives for the proposed anesthesia with the patient or authorized representative who has indicated his/her understanding and acceptance.   Dental advisory given  Plan Discussed with: CRNA and Surgeon  Anesthesia Plan Comments:         Anesthesia Quick Evaluation

## 2016-11-12 NOTE — Anesthesia Procedure Notes (Signed)
Epidural Patient location during procedure: OB Start time: 11/12/2016 12:41 AM End time: 11/12/2016 12:53 AM  Staffing Anesthesiologist: Yves DillARROLL, Benedicto Capozzi Performed: anesthesiologist   Preanesthetic Checklist Completed: patient identified, site marked, surgical consent, pre-op evaluation, timeout performed, IV checked, risks and benefits discussed and monitors and equipment checked  Epidural Patient position: sitting Prep: Betadine Patient monitoring: heart rate, continuous pulse ox and blood pressure Approach: midline Location: L4-L5 Injection technique: LOR saline  Needle:  Needle type: Tuohy  Needle gauge: 18 G Needle length: 9 cm and 9 Catheter type: closed end flexible Catheter size: 20 Guage Test dose: negative and 1.5% lidocaine with Epi 1:200 K  Assessment Sensory level: T8 Events: blood not aspirated, injection not painful, no injection resistance, negative IV test and no paresthesia  Additional Notes Time out called .  Patient placed in sitting position.  Back prepped and draped in sterile fashion.  A skin wheal was made in the L3-L4 interspace with 1% Lidocaine plain.  An 18G Tuohy needle was used to gain access to the epidural space by a loss of resistance technique.  The epidural catheter was threaded 3 cm into the epidural space with a negative TD. The catheter was affixed to the back in sterile fashion.  The patient tolerated the procedure well.Reason for block:procedure for pain

## 2016-11-12 NOTE — Progress Notes (Addendum)
Progress Note - Vaginal Delivery  Darlene Escobar is a 41 y.o. G2P2002 now PP day 0 s/p Vaginal, Spontaneous Delivery .   Subjective:  The patient reports no complaints, up ad lib, voiding, tolerating PO and + flatus  Objective:  Vital signs in last 24 hours: Temp:  [97.7 F (36.5 C)-98.4 F (36.9 C)] 97.7 F (36.5 C) (03/15 1100) Pulse Rate:  [78-128] 80 (03/15 1100) Resp:  [18-20] 18 (03/15 1100) BP: (93-174)/(59-101) 139/86 (03/15 1100) SpO2:  [97 %-100 %] 100 % (03/15 0245) Weight:  [241 lb (109.3 kg)] 241 lb (109.3 kg) (03/15 0107)  Physical Exam:  General: alert, cooperative, appears stated age and fatigued  Lungs: clear bilaterally HR: RRR Bowel Sounds: present  Lochia: appropriate Uterine Fundus: firm @ u -1 DVT Evaluation: No evidence of DVT seen on physical exam.    Data Review  Recent Labs  11/11/16 2345 11/12/16 0943  HGB 12.0 11.1*  HCT 34.4* 32.1*    Assessment/Plan: Active Problems:   Labor and delivery, indication for care   Pt would like to go home tomorrow.   -- Continue routine PP care.     Darlene Escobar, CNM 11/12/2016 2:26 PM

## 2016-11-12 NOTE — Progress Notes (Signed)
Patient received epidural.  EFM tracing maternal heart rate during epidural placement.  U/S adjusted with tracing of fetal heart returning at 1:12.

## 2016-11-13 ENCOUNTER — Encounter: Payer: Self-pay | Admitting: Student

## 2016-11-13 LAB — RPR: RPR: NONREACTIVE

## 2016-11-13 NOTE — Progress Notes (Signed)
Progress Note - Vaginal Delivery  Margette FastSelena Gowens is a 41 y.o. G2P2002 now PP day 1 s/p Vaginal, Spontaneous Delivery .   Subjective:  The patient reports no complaints  Objective:  Vital signs in last 24 hours: Temp:  [97.6 F (36.4 C)-98.1 F (36.7 C)] 98 F (36.7 C) (03/16 1334) Pulse Rate:  [73-81] 80 (03/16 1334) Resp:  [16-20] 20 (03/16 1334) BP: (113-122)/(62-80) 113/69 (03/16 1334) SpO2:  [99 %-100 %] 100 % (03/16 0814)  Physical Exam:  General: alert, cooperative and appears stated age Lochia: appropriate  Uterine Fundus: firm @ U DVT Evaluation: No evidence of DVT seen on physical exam.    Data Review  Recent Labs  11/11/16 2345 11/12/16 0943  HGB 12.0 11.1*  HCT 34.4* 32.1*    Assessment/Plan: Active Problems:   Labor and delivery, indication for care   Plan for discharge tomorrow  -- Continue routine PP care.     Doreene Burkennie Petro Talent, CNM 11/13/2016

## 2016-11-13 NOTE — Anesthesia Postprocedure Evaluation (Signed)
Anesthesia Post Note  Patient: Darlene Escobar  Procedure(s) Performed: * No procedures listed *  Patient location during evaluation: Mother Baby Anesthesia Type: Epidural Level of consciousness: awake and alert Pain management: pain level controlled Vital Signs Assessment: post-procedure vital signs reviewed and stable Respiratory status: spontaneous breathing, nonlabored ventilation and respiratory function stable Cardiovascular status: stable Postop Assessment: no headache, no backache and patient able to bend at knees Anesthetic complications: no     Last Vitals:  Vitals:   11/12/16 1625 11/12/16 1928  BP: 128/71 122/80  Pulse: 82 81  Resp: 20 18  Temp: 36.7 C 36.7 C    Last Pain:  Vitals:   11/13/16 0613  TempSrc:   PainSc: 2                  Cleda MccreedyJoseph K Shere Eisenhart

## 2016-11-14 MED ORDER — DOCUSATE SODIUM 100 MG PO CAPS: 100.0000 mg | ORAL_CAPSULE | Freq: Two times a day (BID) | ORAL | 0 refills | Status: DC

## 2016-11-14 MED ORDER — FERROUS SULFATE 325 (65 FE) MG PO TABS: 325.0000 mg | ORAL_TABLET | Freq: Two times a day (BID) | ORAL | 3 refills | Status: DC

## 2016-11-14 MED ORDER — IBUPROFEN 600 MG PO TABS: 600.0000 mg | ORAL_TABLET | Freq: Four times a day (QID) | ORAL | 0 refills | Status: DC

## 2016-11-14 NOTE — Final Progress Note (Signed)
Discharge Day SOAP Note:  Progress Note - Vaginal Delivery  Darlene Escobar is a 41 y.o. [id number] now PP day 2 s/p Vaginal, Spontaneous Delivery . Delivery was uncomplicated  Subjective  The patient has the following complaints: has no unusual complaints  Pain is controlled with current medications.   Patient is urinating without difficulty.  She is ambulating well.    Objective  Vital signs: BP 124/74 (BP Location: Left Arm)   Pulse 71   Temp 97.8 F (36.6 C) (Oral)   Resp 18   Ht 5\' 3"  (1.6 m)   Wt 241 lb (109.3 kg)   LMP 01/31/2016 (Exact Date)   SpO2 100%   Breastfeeding? Unknown   BMI 42.69 kg/m   Physical Exam: Gen: NAD Fundus Fundal Tone: Firm  Lochia Amount: Scant  Perineum Appearance: Edematous     Data Review Labs: CBC Latest Ref Rng & Units 11/12/2016 11/11/2016 08/19/2016  WBC 3.6 - 11.0 K/uL 16.0(H) 14.5(H) -  Hemoglobin 12.0 - 16.0 g/dL 11.1(L) 12.0 -  Hematocrit 35.0 - 47.0 % 32.1(L) 34.4(L) 35.5  Platelets 150 - 440 K/uL 153 201 -   A POS  Assessment/Plan  Active Problems:   Labor and delivery, indication for care    Plan for discharge today.   Discharge Instructions: Per After Visit Summary. Activity: Advance as tolerated. Pelvic rest for 6 weeks.  Also refer to After Visit Summary Diet: Regular Medications: Allergies as of 11/14/2016   No Known Allergies     Medication List    TAKE these medications   albuterol 108 (90 Base) MCG/ACT inhaler Commonly known as:  PROVENTIL HFA;VENTOLIN HFA Inhale 1 puff into the lungs every 6 (six) hours as needed for wheezing or shortness of breath.   B COMPLEX 1 PO Take by mouth.   docusate sodium 100 MG capsule Commonly known as:  COLACE Take 1 capsule (100 mg total) by mouth 2 (two) times daily.   ferrous sulfate 325 (65 FE) MG tablet Take 1 tablet (325 mg total) by mouth 2 (two) times daily with a meal.   ibuprofen 600 MG tablet Commonly known as:  ADVIL,MOTRIN Take 1  tablet (600 mg total) by mouth every 6 (six) hours.   prenatal multivitamin Tabs tablet Take 1 tablet by mouth daily.   Vitamin D 2000 units Caps Take 1 capsule (2,000 Units total) by mouth as directed.   Zinc 50 MG Caps Take by mouth.      Outpatient follow up:  Postpartum contraception: oral progesterone-only contraceptive  Discharged Condition: good  Discharged to: home  Newborn Data: Disposition:home with mother  Apgars: APGAR (1 MIN): 8   APGAR (5 MINS): 9   APGAR (10 MINS):    Baby Feeding: Breast    Doreene Burkennie Iyahna Obriant, CNM 11/14/2016 11:22 AM

## 2016-11-14 NOTE — Progress Notes (Signed)
Reviewed all patients discharge instructions and handouts regarding postpartum bleeding, no intercourse for 6 weeks, signs and symptoms of mastitis and postpartum bleu's. Reviewed discharge instructions for newborn regarding proper cord care, how and when to bathe the newborn, nail care, proper way to take the baby's temperature, along with safe sleep. All questions have been answered at this time. Patient discharged via wheelchair with RN.  

## 2016-11-14 NOTE — Discharge Summary (Signed)
@  LOGO@                            Discharge Summary  Date of Admission: 11/11/2016  Date of Discharge: 11/14/16  Admitting Diagnosis: Onset of Labor at 2865w6d  Secondary Diagnosis: none  Mode of Delivery: normal spontaneous vaginal delivery    Discharge Diagnosis: No other diagnosis   Intrapartum Procedures: epidural and GBS prophylaxis   Post partum procedures: none  Complications: none                      Discharge Day SOAP Note:  Progress Note - Vaginal Delivery  Darlene Escobar is a 41 y.o. [id number] now PP day 2 s/p Vaginal, Spontaneous Delivery . Delivery was uncomplicated  Subjective  The patient has the following complaints: has no unusual complaints  Pain is controlled with current medications.   Patient is urinating without difficulty.  She is ambulating well.    Objective  Vital signs: BP 124/74 (BP Location: Left Arm)   Pulse 71   Temp 97.8 F (36.6 C) (Oral)   Resp 18   Ht 5\' 3"  (1.6 m)   Wt 241 lb (109.3 kg)   LMP 01/31/2016 (Exact Date)   SpO2 100%   Breastfeeding? Unknown   BMI 42.69 kg/m   Physical Exam: Gen: NAD Fundus Fundal Tone: Firm  Lochia Amount: Scant  Perineum Appearance: Edematous     Data Review Labs: CBC Latest Ref Rng & Units 11/12/2016 11/11/2016 08/19/2016  WBC 3.6 - 11.0 K/uL 16.0(H) 14.5(H) -  Hemoglobin 12.0 - 16.0 g/dL 11.1(L) 12.0 -  Hematocrit 35.0 - 47.0 % 32.1(L) 34.4(L) 35.5  Platelets 150 - 440 K/uL 153 201 -   A POS  Assessment/Plan  Active Problems:   Labor and delivery, indication for care    Plan for discharge today.   Discharge Instructions: Per After Visit Summary. Activity: Advance as tolerated. Pelvic rest for 6 weeks.  Also refer to After Visit Summary Diet: Regular Medications: Allergies as of 11/14/2016   No Known Allergies     Medication List    TAKE these medications   albuterol 108 (90 Base) MCG/ACT inhaler Commonly known as:  PROVENTIL HFA;VENTOLIN HFA Inhale 1 puff into the  lungs every 6 (six) hours as needed for wheezing or shortness of breath.   B COMPLEX 1 PO Take by mouth.   docusate sodium 100 MG capsule Commonly known as:  COLACE Take 1 capsule (100 mg total) by mouth 2 (two) times daily.   ferrous sulfate 325 (65 FE) MG tablet Take 1 tablet (325 mg total) by mouth 2 (two) times daily with a meal.   ibuprofen 600 MG tablet Commonly known as:  ADVIL,MOTRIN Take 1 tablet (600 mg total) by mouth every 6 (six) hours.   prenatal multivitamin Tabs tablet Take 1 tablet by mouth daily.   Vitamin D 2000 units Caps Take 1 capsule (2,000 Units total) by mouth as directed.   Zinc 50 MG Caps Take by mouth.      Outpatient follow up:  Postpartum contraception: oral progesterone-only contraceptive  Discharged Condition: good  Discharged to: home  Newborn Data: Disposition:home with mother  Apgars: APGAR (1 MIN): 8   APGAR (5 MINS): 9   APGAR (10 MINS):    Baby Feeding: Breast    Darlene Escobar, CNM 11/14/2016 11:22 AM

## 2016-11-17 ENCOUNTER — Encounter: Payer: Self-pay | Admitting: Obstetrics and Gynecology

## 2016-11-18 ENCOUNTER — Ambulatory Visit (INDEPENDENT_AMBULATORY_CARE_PROVIDER_SITE_OTHER): Payer: BLUE CROSS/BLUE SHIELD | Admitting: Obstetrics and Gynecology

## 2016-11-18 ENCOUNTER — Encounter: Payer: Self-pay | Admitting: Obstetrics and Gynecology

## 2016-11-18 VITALS — BP 124/78 | HR 98 | Temp 99.5°F | Wt 235.4 lb

## 2016-11-18 DIAGNOSIS — R300 Dysuria: Secondary | ICD-10-CM

## 2016-11-18 DIAGNOSIS — N61 Mastitis without abscess: Secondary | ICD-10-CM

## 2016-11-18 LAB — POCT URINALYSIS DIPSTICK
BILIRUBIN UA: NEGATIVE
GLUCOSE UA: NEGATIVE
KETONES UA: NEGATIVE
Leukocytes, UA: NEGATIVE
NITRITE UA: NEGATIVE
PH UA: 6 (ref 5.0–8.0)
SPEC GRAV UA: 1.015 (ref 1.030–1.035)
Urobilinogen, UA: 0.2 (ref ?–2.0)

## 2016-11-18 MED ORDER — DICLOXACILLIN SODIUM 500 MG PO CAPS: 500.0000 mg | ORAL_CAPSULE | Freq: Four times a day (QID) | ORAL | 1 refills | Status: DC

## 2016-11-18 NOTE — Patient Instructions (Signed)
Breastfeeding and Mastitis  Mastitis is inflammation of the breast tissue. It can occur in women who are breastfeeding. This can make breastfeeding painful. Mastitis will sometimes go away on its own, especially if it is not caused by an infection (non-infectious mastitis). Your health care provider will help determine if medical treatment is needed. Treatment may be needed if the condition is caused by a bacterial infection (infectious mastitis).  What are the causes?  This condition is often associated with a blocked milkduct, which can happen when too much milk builds up in the breast. Causes of excess milk in the breast can include:  · Poor latch-on. If your baby is not latched onto the breast properly, he or she may not empty your breast completely while breastfeeding.  · Allowing too much time to pass between feedings.  · Wearing a bra or other clothing that is too tight. This puts extra pressure on the milk ducts so milk does not flow through them as it should.  · Milk remaining in the breast because it is overfilled (engorged).  · Stress and fatigue.    Mastitis can also be caused by a bacterial infection. Bacteria may enter the breast tissue through cuts, cracks, or openings in the skin near the nipple area. Cracks in the skin are often caused when your baby does not latch on properly to the breast.  What are the signs or symptoms?  Symptoms of this condition include:  · Swelling, redness, tenderness, and pain in an area of the breast. This usually affects the upper part of the breast, toward the armpit region. In most cases, it affects only one breast. In some cases, it may occur on both breasts at the same time and affect a larger portion of breast tissue.  · Swelling of the glands under the arm on the same side.  · Fatigue, headache, and flu-like muscle aches.  · Fever.  · Rapid pulse.    Symptoms usually last 2 to 5 days. Breast pain and redness are at their worst on day 2 and day 3, and they usually go  away by day 5. If an infection is left to progress, a collection of pus (abscess) may develop.  How is this diagnosed?  This condition can be diagnosed based on your symptoms and a physical exam. You may also have tests, such as:  · Blood tests to determine if your body is fighting a bacterial infection.  · Mammogram or ultrasound tests to rule out other problems or diseases.  · Fluid tests. If an abscess has developed, the fluid in the abscess may be removed with a needle. The fluid may be analyzed to determine if bacteria are present.  · Breast milk may be cultured and tested for bacteria.    How is this treated?  This condition will sometimes go away on its own. Your health care provider may choose to wait 24 hours after first seeing you to decide whether treatment is needed. If treatment is needed, it may include:  · Strategies to manage breastfeeding. This includes continuing to breastfeed or pump in order to allow adequate milk flow, using breast massage, and applying heat or cold to the affected area.  · Self-care such as rest and increased fluid intake.  · Medicine for pain.  · Antibiotic medicine to treat a bacterial infection. This is usually taken by mouth.  · If an abscess has developed, it may be treated by removing fluid with a needle.      Follow these instructions at home:  Medicines  · Take over-the-counter and prescription medicines only as told by your health care provider.  · If you were prescribed an antibiotic medicine, take it as told by your health care provider. Do not stop taking the antibiotic even if you start to feel better.  General instructions  · Do not wear a tight or underwire bra. Wear a soft, supportive bra.  · Increase your fluid intake, especially if you have a fever.  · Get plenty of rest.  For breastfeeding:  · Continue to empty your breasts as often as possible, either by breastfeeding or using an electric breast pump. This will lower the pressure and the pain that comes with  it. Ask your health care provider if changes need to be made to your breastfeeding or pumping routine.  · Keep your nipples clean and dry.  · During breastfeeding, empty the first breast completely before going to the other breast. If your baby is not emptying your breasts completely, use a breast pump to empty your breasts.  · Use breast massage during feeding or pumping sessions.  · If directed, apply moist heat to the affected area of your breast right before breastfeeding or pumping. Use the heat source that your health care provider recommends.  · If directed, put ice on the affected area of your breast right after breastfeeding or pumping:  ? Put ice in a plastic bag.  ? Place a towel between your skin and the bag.  ? Leave the ice on for 20 minutes.  · If you go back to work, pump your breasts while at work to stay in time with your nursing schedule.  · Do not allow your breasts to become engorged.  Contact a health care provider if:  · You have pus-like discharge from the breast.  · You have a fever.  · Your symptoms do not improve within 2 days of starting treatment.  · Your symptoms return after you have recovered from a breast infection.  Get help right away if:  · Your pain and swelling are getting worse.  · You have pain that is not controlled with medicine.  · You have a red line extending from the breast toward your armpit.  Summary  · Mastitis is inflammation of the breast tissue. It is often caused by a blocked milk duct or bacteria.  · This condition may be treated with hot and cold compresses, medicines, self-care, and certain breastfeeding strategies.  · If you were prescribed an antibiotic medicine, take it as told by your health care provider. Do not stop taking the antibiotic even if you start to feel better.  · Continue to empty your breasts as often as possible either by breastfeeding or using an electric breast pump.  This information is not intended to replace advice given to you by your  health care provider. Make sure you discuss any questions you have with your health care provider.  Document Released: 12/12/2004 Document Revised: 08/18/2016 Document Reviewed: 08/18/2016  Elsevier Interactive Patient Education © 2017 [employer]

## 2016-11-18 NOTE — Progress Notes (Signed)
Subjective:     Patient ID: Darlene Escobar, female   DOB: February 22, 1976, 41 y.o.   MRN: [id number]  Fever       Review of Systems  Constitutional: Positive for fever.       Objective:   Physical Exam A&O x4 Well groomed fatigued female in no acute distress, pale Blood pressure 124/78, pulse 98, temperature 99.5 F (37.5 C), weight 235 lb 6.4 oz (106.8 kg), currently breastfeeding. Breasts: lactating with full milk ducts noted bilaterally, small area of erythema noted on underside of left breast-tender to touch.both nipples intact    Assessment:     mastitis, lactation    Plan:     rx sent in and instructed on use and expected outcome. Encouraged rest, po hydration and tylenol use as needed.  RTC as needed.  Darlene Escobar, CNM

## 2016-11-19 ENCOUNTER — Encounter: Payer: Self-pay | Admitting: Obstetrics and Gynecology

## 2016-12-24 ENCOUNTER — Encounter: Payer: BLUE CROSS/BLUE SHIELD | Admitting: Obstetrics and Gynecology

## 2016-12-30 ENCOUNTER — Encounter: Payer: Self-pay | Admitting: Obstetrics and Gynecology

## 2016-12-30 ENCOUNTER — Ambulatory Visit (INDEPENDENT_AMBULATORY_CARE_PROVIDER_SITE_OTHER): Payer: BLUE CROSS/BLUE SHIELD | Admitting: Obstetrics and Gynecology

## 2016-12-30 MED ORDER — NORETHINDRONE 0.35 MG PO TABS: 1.0000 | ORAL_TABLET | Freq: Every day | ORAL | 11 refills | Status: DC

## 2016-12-30 NOTE — Progress Notes (Signed)
   Subjective:     Darlene Escobar is a 41 y.o. female who presents for a postpartum visit. She is 6 weeks postpartum following a spontaneous vaginal delivery. I have fully reviewed the prenatal and intrapartum course. The delivery was at 39.6 gestational weeks. Outcome: spontaneous vaginal delivery. Anesthesia: epidural. Postpartum course has been complicated by mastitis. Baby's course has been uncomplicated. Baby is feeding by breast. Bleeding no bleeding. Bowel function is normal. Bladder function is normal. Patient is not sexually active. Contraception method is abstinence. Postpartum depression screening: negative.  The following portions of the patient's history were reviewed and updated as appropriate: allergies, current medications, past family history, past medical history, past social history, past surgical history and problem list.  Review of Systems A comprehensive review of systems was negative.   Depression screen [facility] 2/9 12/30/2016 04/16/2015  Decreased Interest 0 0  Down, Depressed, Hopeless 0 0  PHQ - 2 Score 0 0  Altered sleeping 0 -  Tired, decreased energy 1 -  Change in appetite 0 -  Feeling bad or failure about yourself  0 -  Trouble concentrating 0 -  Moving slowly or fidgety/restless 0 -  Suicidal thoughts 0 -  PHQ-9 Score 1 -    Objective:    BP 117/87   Pulse 83   Wt 227 lb 9.6 oz (103.2 kg)   Breastfeeding? Yes   BMI 40.32 kg/m   General:  alert, cooperative, appears stated age and moderately obese   Breasts:  inspection negative, no nipple discharge or bleeding, no masses or nodularity palpable  Lungs: clear to auscultation bilaterally  Heart:  regular rate and rhythm, S1, S2 normal, no murmur, click, rub or gallop  Abdomen: soft, non-tender; bowel sounds normal; no masses,  no organomegaly   Vulva:  normal  Vagina: normal vagina  Cervix:  multiparous appearance  Corpus: normal size, contour, position, consistency, mobility, non-tender  Adnexa:   normal adnexa and no mass, fullness, tenderness  Rectal Exam: Not performed.        Assessment:     6 weeks  postpartum exam. Pap smear not done at today's visit.   Plan:    1. Contraception: oral progesterone-only contraceptive 2. Labs obtained 3. Follow up in: 4 months or as needed.

## 2016-12-30 NOTE — Progress Notes (Signed)
Patient ID: Darlene Escobar, female   DOB: 1976-03-11, 41 y.o.   MRN: [id number] Pt's PHQ 9 score 1.  Pt has mole on inside of right inner thigh, came up when pregnant and feels like it is larger. Has interest in birth control.

## 2016-12-30 NOTE — Patient Instructions (Signed)
  Place postpartum visit patient instructions here.  

## 2016-12-31 LAB — CBC
Hematocrit: 35.7 % (ref 34.0–46.6)
Hemoglobin: 12.2 g/dL (ref 11.1–15.9)
MCH: 32.3 pg (ref 26.6–33.0)
MCHC: 34.2 g/dL (ref 31.5–35.7)
MCV: 94 fL (ref 79–97)
PLATELETS: 228 10*3/uL (ref 150–379)
RBC: 3.78 x10E6/uL (ref 3.77–5.28)
RDW: 13.4 % (ref 12.3–15.4)
WBC: 5.8 10*3/uL (ref 3.4–10.8)

## 2016-12-31 LAB — VITAMIN D 25 HYDROXY (VIT D DEFICIENCY, FRACTURES): Vit D, 25-Hydroxy: 24.1 ng/mL — ABNORMAL LOW (ref 30.0–100.0)

## 2016-12-31 LAB — FERRITIN: Ferritin: 75 ng/mL (ref 15–150)

## 2017-02-18 ENCOUNTER — Ambulatory Visit (INDEPENDENT_AMBULATORY_CARE_PROVIDER_SITE_OTHER): Payer: BLUE CROSS/BLUE SHIELD | Admitting: Obstetrics and Gynecology

## 2017-02-18 ENCOUNTER — Encounter: Payer: Self-pay | Admitting: Obstetrics and Gynecology

## 2017-02-18 VITALS — BP 111/72 | HR 93 | Temp 98.8°F | Ht 63.0 in | Wt 230.8 lb

## 2017-02-18 DIAGNOSIS — N61 Mastitis without abscess: Secondary | ICD-10-CM | POA: Diagnosis not present

## 2017-02-18 MED ORDER — DICLOXACILLIN SODIUM 500 MG PO CAPS: 500.0000 mg | ORAL_CAPSULE | Freq: Four times a day (QID) | ORAL | 1 refills | Status: DC

## 2017-02-18 NOTE — Progress Notes (Signed)
Subjective:     Patient ID: Margette FastSelena Carreto, female   DOB: 1976/07/14, 41 y.o.   MRN: [id number]  HPI Reports noticing red streaks on right breast yesterday (thought infant had scratched breast while nursing) and this morning that breast is very sore and red all underneath. Looks the same as last time when she had mastitis.  Denies fever or skin break down. Is still exclusively nursing.  Review of Systems Negative except stated in HPI    Objective:   Physical Exam A&Ox4 Well groomed female Blood pressure 111/72, pulse 93, temperature 98.8 F (37.1 C), height 5\' 3"  (1.6 m), weight 230 lb 12.8 oz (104.7 kg), currently breastfeeding. Breasts: left breast normal without mass, skin or nipple changes or axillary nodes, right nipple without skin breakdown, with erythema and tenderness from 1 to 7 o clock .    Assessment:     Right breast mastitis    Plan:     Restart dynapen 500mg  qid x 10 days. Rest and push fluids.  RTC as needed.  Melody RockportShambley, CNM

## 2017-05-05 ENCOUNTER — Other Ambulatory Visit: Payer: Self-pay | Admitting: Obstetrics and Gynecology

## 2017-05-05 ENCOUNTER — Ambulatory Visit (INDEPENDENT_AMBULATORY_CARE_PROVIDER_SITE_OTHER): Payer: BLUE CROSS/BLUE SHIELD | Admitting: Obstetrics and Gynecology

## 2017-05-05 ENCOUNTER — Encounter: Payer: Self-pay | Admitting: Obstetrics and Gynecology

## 2017-05-05 VITALS — BP 112/75 | HR 87 | Ht 63.0 in | Wt 233.2 lb

## 2017-05-05 DIAGNOSIS — E559 Vitamin D deficiency, unspecified: Secondary | ICD-10-CM | POA: Diagnosis not present

## 2017-05-05 DIAGNOSIS — Z01419 Encounter for gynecological examination (general) (routine) without abnormal findings: Secondary | ICD-10-CM

## 2017-05-05 NOTE — Patient Instructions (Signed)
Preventive Care 18-39 Years, Female Preventive care refers to lifestyle choices and visits with your health care provider that can promote health and wellness. What does preventive care include?  A yearly physical exam. This is also called an annual well check.  Dental exams once or twice a year.  Routine eye exams. Ask your health care provider how often you should have your eyes checked.  Personal lifestyle choices, including: ? Daily care of your teeth and gums. ? Regular physical activity. ? Eating a healthy diet. ? Avoiding tobacco and drug use. ? Limiting alcohol use. ? Practicing safe sex. ? Taking vitamin and mineral supplements as recommended by your health care provider. What happens during an annual well check? The services and screenings done by your health care provider during your annual well check will depend on your age, overall health, lifestyle risk factors, and family history of disease. Counseling Your health care provider may ask you questions about your:  Alcohol use.  Tobacco use.  Drug use.  Emotional well-being.  Home and relationship well-being.  Sexual activity.  Eating habits.  Work and work Statistician.  Method of birth control.  Menstrual cycle.  Pregnancy history.  Screening You may have the following tests or measurements:  Height, weight, and BMI.  Diabetes screening. This is done by checking your blood sugar (glucose) after you have not eaten for a while (fasting).  Blood pressure.  Lipid and cholesterol levels. These may be checked every 5 years starting at age 38.  Skin check.  Hepatitis C blood test.  Hepatitis B blood test.  Sexually transmitted disease (STD) testing.  BRCA-related cancer screening. This may be done if you have a family history of breast, ovarian, tubal, or peritoneal cancers.  Pelvic exam and Pap test. This may be done every 3 years starting at age 38. Starting at age 30, this may be done  every 5 years if you have a Pap test in combination with an HPV test.  Discuss your test results, treatment options, and if necessary, the need for more tests with your health care provider. Vaccines Your health care provider may recommend certain vaccines, such as:  Influenza vaccine. This is recommended every year.  Tetanus, diphtheria, and acellular pertussis (Tdap, Td) vaccine. You may need a Td booster every 10 years.  Varicella vaccine. You may need this if you have not been vaccinated.  HPV vaccine. If you are 39 or younger, you may need three doses over 6 months.  Measles, mumps, and rubella (MMR) vaccine. You may need at least one dose of MMR. You may also need a second dose.  Pneumococcal 13-valent conjugate (PCV13) vaccine. You may need this if you have certain conditions and were not previously vaccinated.  Pneumococcal polysaccharide (PPSV23) vaccine. You may need one or two doses if you smoke cigarettes or if you have certain conditions.  Meningococcal vaccine. One dose is recommended if you are age 68-21 years and a first-year college student living in a residence hall, or if you have one of several medical conditions. You may also need additional booster doses.  Hepatitis A vaccine. You may need this if you have certain conditions or if you travel or work in places where you may be exposed to hepatitis A.  Hepatitis B vaccine. You may need this if you have certain conditions or if you travel or work in places where you may be exposed to hepatitis B.  Haemophilus influenzae type b (Hib) vaccine. You may need this  if you have certain risk factors.  Talk to your health care provider about which screenings and vaccines you need and how often you need them. This information is not intended to replace advice given to you by your health care provider. Make sure you discuss any questions you have with your health care provider. Document Released: 10/13/2001 Document Revised:  05/06/2016 Document Reviewed: 06/18/2015 Elsevier Interactive Patient Education  2017 Elsevier Inc.  

## 2017-05-05 NOTE — Progress Notes (Signed)
Subjective:   Darlene FastSelena Escobar is a 41 y.o. [id number] Caucasian female here for a routine well-woman exam.  Patient's last menstrual period was 04/24/2007.    Current complaints: hasn't lost weight with breast feeding PCP: me       Does need labs  Social History: Sexual: heterosexual Marital Status: married Living situation: with family Occupation: homemaker Tobacco/alcohol: no tobacco use Illicit drugs: no history of illicit drug use  The following portions of the patient's history were reviewed and updated as appropriate: allergies, current medications, past family history, past medical history, past social history, past surgical history and problem list.  Past Medical History Past Medical History:  Diagnosis Date  . Mild asthma     Past Surgical History Past Surgical History:  Procedure Laterality Date  . TONSILLECTOMY      Gynecologic History [id number]  Patient's last menstrual period was 04/24/2007. Contraception: condoms Last Pap: 2015. Results were: normal   Obstetric History OB History  Gravida Para Term Preterm AB Living  2 2 2     2   SAB TAB Ectopic Multiple Live Births        0 2    # Outcome Date GA Lbr Len/2nd Weight Sex Delivery Anes PTL Lv  2 Term 11/12/16 [id number]  8 lb 13.8 oz (4.02 kg) M Vag-Spont EPI  LIV  1 Term 03/05/15 [id number] / 00:17 8 lb 8.5 oz (3.87 kg) M Vag-Spont None  LIV      Current Medications Current Outpatient Prescriptions on File Prior to Visit  Medication Sig Dispense Refill  . B Complex Vitamins (B COMPLEX 1 PO) Take by mouth.    . Cholecalciferol (VITAMIN D) 2000 UNITS CAPS Take 1 capsule (2,000 Units total) by mouth as directed. 90 capsule 2  . ibuprofen (ADVIL,MOTRIN) 600 MG tablet Take 1 tablet (600 mg total) by mouth every 6 (six) hours. 30 tablet 0  . dicloxacillin (DYNAPEN) 500 MG capsule Take 1 capsule (500 mg total) by mouth 4 (four) times daily. (Patient not taking: Reported on 05/05/2017) 40 capsule 1   No current  facility-administered medications on file prior to visit.     Review of Systems Patient denies any headaches, blurred vision, shortness of breath, chest pain, abdominal pain, problems with bowel movements, urination, or intercourse.  Objective:  BP 112/75   Pulse 87   Ht 5\' 3"  (1.6 m)   Wt 233 lb 3.2 oz (105.8 kg)   LMP 04/24/2007   Breastfeeding? Yes   BMI 41.31 kg/m  Physical Exam  General:  Well developed, well nourished, no acute distress. She is alert and oriented x3. Skin:  Warm and dry Neck:  Midline trachea, no thyromegaly or nodules Cardiovascular: Regular rate and rhythm, no murmur heard Lungs:  Effort normal, all lung fields clear to auscultation bilaterally Breasts:  No dominant palpable mass, retraction, or nipple discharge Abdomen:  Soft, non tender, no hepatosplenomegaly or masses Pelvic:  External genitalia is normal in appearance.  The vagina is normal in appearance. The cervix is bulbous, no CMT.  Thin prep pap is done with HR HPV cotesting. Uterus is felt to be normal size, shape, and contour.  No adnexal masses or tenderness noted. Extremities:  No swelling or varicosities noted Psych:  She has a normal mood and affect  Assessment:   Healthy well-woman exam obesity  Plan:  Encouraged weight loss F/U 1 year for AE, or sooner if needed   Maury Bamba Suzan NailerN Stormi Vandevelde, CNM

## 2017-05-06 ENCOUNTER — Other Ambulatory Visit: Payer: Self-pay | Admitting: Obstetrics and Gynecology

## 2017-05-06 DIAGNOSIS — E559 Vitamin D deficiency, unspecified: Secondary | ICD-10-CM

## 2017-05-06 LAB — COMPREHENSIVE METABOLIC PANEL
ALT: 14 IU/L (ref 0–32)
AST: 18 IU/L (ref 0–40)
Albumin/Globulin Ratio: 1.8 (ref 1.2–2.2)
Albumin: 4.6 g/dL (ref 3.5–5.5)
Alkaline Phosphatase: 76 IU/L (ref 39–117)
BUN/Creatinine Ratio: 15 (ref 9–23)
BUN: 12 mg/dL (ref 6–24)
Bilirubin Total: 0.3 mg/dL (ref 0.0–1.2)
CALCIUM: 9.4 mg/dL (ref 8.7–10.2)
CO2: 19 mmol/L — AB (ref 20–29)
Chloride: 105 mmol/L (ref 96–106)
Creatinine, Ser: 0.82 mg/dL (ref 0.57–1.00)
GFR, EST AFRICAN AMERICAN: 104 mL/min/{1.73_m2} (ref >59)
GFR, EST NON AFRICAN AMERICAN: 90 mL/min/{1.73_m2} (ref >59)
GLUCOSE: 89 mg/dL (ref 65–99)
Globulin, Total: 2.6 g/dL (ref 1.5–4.5)
POTASSIUM: 4.5 mmol/L (ref 3.5–5.2)
Sodium: 141 mmol/L (ref 134–144)
TOTAL PROTEIN: 7.2 g/dL (ref 6.0–8.5)

## 2017-05-06 LAB — LIPID PANEL
CHOL/HDL RATIO: 3.4 ratio (ref 0.0–4.4)
Cholesterol, Total: 200 mg/dL — ABNORMAL HIGH (ref 100–199)
HDL: 58 mg/dL (ref >39)
LDL Calculated: 120 mg/dL — ABNORMAL HIGH (ref 0–99)
Triglycerides: 111 mg/dL (ref 0–149)
VLDL Cholesterol Cal: 22 mg/dL (ref 5–40)

## 2017-05-06 LAB — HEMOGLOBIN A1C
ESTIMATED AVERAGE GLUCOSE: 103 mg/dL
Hgb A1c MFr Bld: 5.2 % (ref 4.8–5.6)

## 2017-05-06 LAB — VITAMIN D 25 HYDROXY (VIT D DEFICIENCY, FRACTURES): Vit D, 25-Hydroxy: 28.6 ng/mL — ABNORMAL LOW (ref 30.0–100.0)

## 2017-05-06 MED ORDER — VITAMIN D (ERGOCALCIFEROL) 1.25 MG (50000 UNIT) PO CAPS: 50000.0000 [IU] | ORAL_CAPSULE | ORAL | 1 refills | Status: DC

## 2017-05-07 LAB — CYTOLOGY - PAP

## 2017-05-09 ENCOUNTER — Encounter: Payer: Self-pay | Admitting: Obstetrics and Gynecology

## 2017-05-10 ENCOUNTER — Other Ambulatory Visit: Payer: Self-pay | Admitting: *Deleted

## 2017-05-10 MED ORDER — VITAMIN D (ERGOCALCIFEROL) 1.25 MG (50000 UNIT) PO CAPS: 50000.0000 [IU] | ORAL_CAPSULE | ORAL | 1 refills | Status: DC

## 2017-06-25 ENCOUNTER — Encounter: Payer: Self-pay | Admitting: Certified Nurse Midwife

## 2017-06-25 ENCOUNTER — Ambulatory Visit (INDEPENDENT_AMBULATORY_CARE_PROVIDER_SITE_OTHER): Payer: BLUE CROSS/BLUE SHIELD | Admitting: Certified Nurse Midwife

## 2017-06-25 VITALS — Wt 233.3 lb

## 2017-06-25 DIAGNOSIS — O99345 Other mental disorders complicating the puerperium: Principal | ICD-10-CM

## 2017-06-25 DIAGNOSIS — F53 Postpartum depression: Secondary | ICD-10-CM | POA: Diagnosis not present

## 2017-06-25 MED ORDER — SERTRALINE HCL 50 MG PO TABS: 50.0000 mg | ORAL_TABLET | Freq: Every day | ORAL | 1 refills | Status: DC

## 2017-06-25 NOTE — Patient Instructions (Addendum)
Sertraline tablets What is this medicine? SERTRALINE (SER tra leen) is used to treat depression. It may also be used to treat obsessive compulsive disorder, panic disorder, post-trauma stress, premenstrual dysphoric disorder (PMDD) or social anxiety. This medicine may be used for other purposes; ask your health care provider or pharmacist if you have questions. COMMON BRAND NAME(S): Zoloft What should I tell my health care provider before I take this medicine? They need to know if you have any of these conditions: -bleeding disorders -bipolar disorder or a family history of bipolar disorder -glaucoma -heart disease -high blood pressure -history of irregular heartbeat -history of low levels of calcium, magnesium, or potassium in the blood -if you often drink alcohol -liver disease -receiving electroconvulsive therapy -seizures -suicidal thoughts, plans, or attempt; a previous suicide attempt by you or a family member -take medicines that treat or prevent blood clots -thyroid disease -an unusual or allergic reaction to sertraline, other medicines, foods, dyes, or preservatives -pregnant or trying to get pregnant -breast-feeding How should I use this medicine? Take this medicine by mouth with a glass of water. Follow the directions on the prescription label. You can take it with or without food. Take your medicine at regular intervals. Do not take your medicine more often than directed. Do not stop taking this medicine suddenly except upon the advice of your doctor. Stopping this medicine too quickly may cause serious side effects or your condition may worsen. A special MedGuide will be given to you by the pharmacist with each prescription and refill. Be sure to read this information carefully each time. Talk to your pediatrician regarding the use of this medicine in children. While this drug may be prescribed for children as young as 7 years for selected conditions, precautions do  apply. Overdosage: If you think you have taken too much of this medicine contact a poison control center or emergency room at once. NOTE: This medicine is only for you. Do not share this medicine with others. What if I miss a dose? If you miss a dose, take it as soon as you can. If it is almost time for your next dose, take only that dose. Do not take double or extra doses. What may interact with this medicine? Do not take this medicine with any of the following medications: -cisapride -dofetilide -dronedarone -linezolid -MAOIs like Carbex, Eldepryl, Marplan, Nardil, and Parnate -methylene blue (injected into a vein) -pimozide -thioridazine This medicine may also interact with the following medications: -alcohol -amphetamines -aspirin and aspirin-like medicines -certain medicines for depression, anxiety, or psychotic disturbances -certain medicines for fungal infections like ketoconazole, fluconazole, posaconazole, and itraconazole -certain medicines for irregular heart beat like flecainide, quinidine, propafenone -certain medicines for migraine headaches like almotriptan, eletriptan, frovatriptan, naratriptan, rizatriptan, sumatriptan, zolmitriptan -certain medicines for sleep -certain medicines for seizures like carbamazepine, valproic acid, phenytoin -certain medicines that treat or prevent blood clots like warfarin, enoxaparin, dalteparin -cimetidine -digoxin -diuretics -fentanyl -isoniazid -lithium -NSAIDs, medicines for pain and inflammation, like ibuprofen or naproxen -other medicines that prolong the QT interval (cause an abnormal heart rhythm) -rasagiline -safinamide -supplements like St. John's wort, kava kava, valerian -tolbutamide -tramadol -tryptophan This list may not describe all possible interactions. Give your health care provider a list of all the medicines, herbs, non-prescription drugs, or dietary supplements you use. Also tell them if you smoke, drink  alcohol, or use illegal drugs. Some items may interact with your medicine. What should I watch for while using this medicine? Tell your doctor if your symptoms   do not get better or if they get worse. Visit your doctor or health care professional for regular checks on your progress. Because it may take several weeks to see the full effects of this medicine, it is important to continue your treatment as prescribed by your doctor. Patients and their families should watch out for new or worsening thoughts of suicide or depression. Also watch out for sudden changes in feelings such as feeling anxious, agitated, panicky, irritable, hostile, aggressive, impulsive, severely restless, overly excited and hyperactive, or not being able to sleep. If this happens, especially at the beginning of treatment or after a change in dose, call your health care professional. Dennis Bast may get drowsy or dizzy. Do not drive, use machinery, or do anything that needs mental alertness until you know how this medicine affects you. Do not stand or sit up quickly, especially if you are an older patient. This reduces the risk of dizzy or fainting spells. Alcohol may interfere with the effect of this medicine. Avoid alcoholic drinks. Your mouth may get dry. Chewing sugarless gum or sucking hard candy, and drinking plenty of water may help. Contact your doctor if the problem does not go away or is severe. What side effects may I notice from receiving this medicine? Side effects that you should report to your doctor or health care professional as soon as possible: -allergic reactions like skin rash, itching or hives, swelling of the face, lips, or tongue -anxious -black, tarry stools -changes in vision -confusion -elevated mood, decreased need for sleep, racing thoughts, impulsive behavior -eye pain -fast, irregular heartbeat -feeling faint or lightheaded, falls -feeling agitated, angry, or irritable -hallucination, loss of contact with  reality -loss of balance or coordination -loss of memory -painful or prolonged erections -restlessness, pacing, inability to keep still -seizures -stiff muscles -suicidal thoughts or other mood changes -trouble sleeping -unusual bleeding or bruising -unusually weak or tired -vomiting Side effects that usually do not require medical attention (report to your doctor or health care professional if they continue or are bothersome): -change in appetite or weight -change in sex drive or performance -diarrhea -increased sweating -indigestion, nausea -tremors This list may not describe all possible side effects. Call your doctor for medical advice about side effects. You may report side effects to FDA at 1-800-FDA-1088. Where should I keep my medicine? Keep out of the reach of children. Store at room temperature between 15 and 30 degrees C (59 and 86 degrees F). Throw away any unused medicine after the expiration date. NOTE: This sheet is a summary. It may not cover all possible information. If you have questions about this medicine, talk to your doctor, pharmacist, or health care provider.  2018 Elsevier/Gold Standard (2016-08-21 14:17:49) Perinatal Depression When a woman feels excessive sadness, anger, or anxiety during pregnancy or during the first 12 months after she gives birth, she has a condition called perinatal depression. Depression can interfere with work, school, relationships, and other everyday activities. If it is not managed properly, it can also cause problems in the mother and her baby. Sometimes, perinatal depression is left untreated because symptoms are thought to be normal mood swings during and right after pregnancy. If you have symptoms of depression, it is important to talk with your health care provider. What are the causes? The exact cause of this condition is not known. Hormonal changes during and after pregnancy may play a role in causing perinatal  depression. What increases the risk? You are more likely to develop this condition  if:  You have a personal or family history of depression, anxiety, or mood disorders.  You experience a stressful life event during pregnancy, such as the death of a loved one.  You have a lot of regular life stress.  You do not have support from family members or loved ones, or you are in an abusive relationship.  What are the signs or symptoms? Symptoms of this condition include:  Feeling sad or hopeless.  Feelings of guilt.  Feeling irritable or overwhelmed.  Changes in your appetite.  Lack of energy or motivation.  Sleep problems.  Difficulty concentrating or completing tasks.  Loss of interest in hobbies or relationships.  Headaches or stomach problems that do not go away.  How is this diagnosed? This condition is diagnosed based on a physical exam and mental evaluation. In some cases, your health care provider may use a depression screening tool. These tools include a list of questions that can help a health care provider diagnose depression. Your health care provider may refer you to a mental health expert who specializes in depression. How is this treated? This condition may be treated with:  Medicines. Your health care provider will only give you medicines that have been proven safe for pregnancy and breastfeeding.  Talk therapy with a mental health professional to help change your patterns of thinking (cognitive behavioral therapy).  Support groups.  Brain stimulation or light therapies.  Stress reduction therapies, such as mindfulness.  Follow these instructions at home: Lifestyle  Do not use any products that contain nicotine or tobacco, such as cigarettes and e-cigarettes. If you need help quitting, ask your health care provider.  Do not use alcohol when you are pregnant. After your baby is born, limit alcohol intake to no more than 1 drink a day. One drink equals 12 oz  of beer, 5 oz of wine, or 1 oz of hard liquor.  Consider joining a support group for new mothers. Ask your health care provider for recommendations.  Take good care of yourself. Make sure you: ? Get plenty of sleep. If you are having trouble sleeping, talk with your health care provider. ? Eat a healthy diet. This includes plenty of fruits and vegetables, whole grains, and lean proteins. ? Exercise regularly, as told by your health care provider. Ask your health care provider what exercises are safe for you. General instructions  Take over-the-counter and prescription medicines only as told by your health care provider.  Talk with your partner or family members about your feelings during pregnancy. Share any concerns or anxieties that you may have.  Ask for help with tasks or chores when you need it. Ask friends and family members to provide meals, watch your children, or help with cleaning.  Keep all follow-up visits as told by your health care provider. This is important. Contact a health care provider if:  You (or people close to you) notice that you have any symptoms of depression.  You have depression and your symptoms get worse.  You experience side effects from medicines, such as nausea or sleep problems. Get help right away if:  You feel like hurting yourself, your baby, or someone else. If you ever feel like you may hurt yourself or others, or have thoughts about taking your own life, get help right away. You can go to your nearest emergency department or call:  Your local emergency services (911 in the [place]  A suicide crisis helpline, such as the [facility] at  80754158791-(770) 232-4477. This is open 24 hours a day.  Summary  Perinatal depression is when a woman feels excessive sadness, anger, or anxiety during pregnancy or during the first 12 months after she gives birth.  If perinatal depression is not treated, it can lead to health problems for the  mother and her baby.  This condition is treated with medicines, talk therapy, stress reduction therapies, or a combination of two or more treatments.  Talk with your partner or family members about your feelings. Do not be afraid to ask for help. This information is not intended to replace advice given to you by your health care provider. Make sure you discuss any questions you have with your health care provider. Document Released: 10/14/2016 Document Revised: 10/14/2016 Document Reviewed: 10/14/2016 Elsevier Interactive Patient Education  [employer]2018 Elsevier Inc.

## 2017-06-25 NOTE — Progress Notes (Signed)
GYN ENCOUNTER NOTE  Subjective:      Darlene Escobar is a 41 y.o. [id number] female here for evaluation of postpartum depression that has gotten worse over the last three (3) weeks.   Reports daily crying, frustration, feelings of failure and sadness, and inability to complete tasks. She is "overwhelmed" by her life.   Recent changes include: her husband's work schedule, her husband enrolling in school, decreased child care due to MIL traveling and mother caring for grandmother, and grandmother currently in home hospice.   Denies suicidal and homicidal ideations. No difficulty breathing or respiratory distress, chest pain, abdominal pain, vaginal bleeding, dysuria, and leg pain or swelling.   Open to medication and therapy at this time.    Gynecologic History  No LMP recorded.   Contraception: condoms  Last Pap: 05/05/2017. Results were: normal  Obstetric History  OB History  Gravida Para Term Preterm AB Living  2 2 2     2   SAB TAB Ectopic Multiple Live Births        0 2    # Outcome Date GA Lbr Len/2nd Weight Sex Delivery Anes PTL Lv  2 Term 11/12/16 [id number]  8 lb 13.8 oz (4.02 kg) M Vag-Spont EPI  LIV  1 Term 03/05/15 [id number] / 00:17 8 lb 8.5 oz (3.87 kg) M Vag-Spont None  LIV      Past Medical History:  Diagnosis Date  . Mild asthma     Past Surgical History:  Procedure Laterality Date  . TONSILLECTOMY      Current Outpatient Prescriptions on File Prior to Visit  Medication Sig Dispense Refill  . B Complex Vitamins (B COMPLEX 1 PO) Take by mouth.    . Vitamin D, Ergocalciferol, (DRISDOL) 50000 units CAPS capsule Take 1 capsule (50,000 Units total) by mouth 2 (two) times a week. 30 capsule 1  . dicloxacillin (DYNAPEN) 500 MG capsule Take 1 capsule (500 mg total) by mouth 4 (four) times daily. (Patient not taking: Reported on 05/05/2017) 40 capsule 1  . ibuprofen (ADVIL,MOTRIN) 600 MG tablet Take 1 tablet (600 mg total) by mouth every 6 (six) hours. (Patient not taking:  Reported on 06/25/2017) 30 tablet 0   No current facility-administered medications on file prior to visit.     No Known Allergies  Social History   Social History  . Marital status: Married    Spouse name: N/A  . Number of children: N/A  . Years of education: N/A   Occupational History  . Not on file.   Social History Main Topics  . Smoking status: Never Smoker  . Smokeless tobacco: Never Used  . Alcohol use No  . Drug use: No  . Sexual activity: Yes    Birth control/ protection: Condom   Other Topics Concern  . Not on file   Social History Narrative  . No narrative on file    Family History  Problem Relation Age of Onset  . Diabetes Mother   . Kidney cancer Mother   . Heart disease Father   . Cancer Neg Hx     The following portions of the patient's history were reviewed and updated as appropriate: allergies, current medications, past family history, past medical history, past social history, past surgical history and problem list.  Review of Systems  Review of Systems - negative except as noted above. Information obtained from patient.  Objective:   Wt 233 lb 4.8 oz (105.8 kg)   Breastfeeding? Yes   BMI 41.33 kg/m  General: Alert and oriented x 4, no apparent distress.   Depression screen PHQ 2/9 06/25/2017  Decreased Interest 3  Down, Depressed, Hopeless 2  PHQ - 2 Score 5  Altered sleeping 1  Tired, decreased energy 3  Change in appetite 3  Feeling bad or failure about yourself  3  Trouble concentrating 3  Moving slowly or fidgety/restless -  Suicidal thoughts 1  PHQ-9 Score 19  Difficult doing work/chores Extremely dIfficult   Assessment:   1. Postpartum depression  Plan:   Rx: Zoloft, see orders.   Encouraged self care measures including counseling or therapy. Information given for Loletta Parish, local christian counselor.   Reviewed red flag symptoms and when to call.   Instructed patient to send mood update via MyChart in  the next 2-3 weeks.   RTC x 6 weeks for medication check or sooner if needed.    Gunnar Bulla, CNM

## 2017-06-26 ENCOUNTER — Encounter: Payer: Self-pay | Admitting: Certified Nurse Midwife

## 2017-06-27 ENCOUNTER — Encounter: Payer: Self-pay | Admitting: Certified Nurse Midwife

## 2017-08-06 ENCOUNTER — Encounter: Payer: Self-pay | Admitting: Obstetrics and Gynecology

## 2017-08-06 ENCOUNTER — Ambulatory Visit (INDEPENDENT_AMBULATORY_CARE_PROVIDER_SITE_OTHER): Payer: BLUE CROSS/BLUE SHIELD | Admitting: Obstetrics and Gynecology

## 2017-08-06 VITALS — BP 122/72 | HR 80 | Ht 63.0 in | Wt 227.6 lb

## 2017-08-06 DIAGNOSIS — J069 Acute upper respiratory infection, unspecified: Secondary | ICD-10-CM

## 2017-08-06 DIAGNOSIS — F53 Postpartum depression: Secondary | ICD-10-CM | POA: Diagnosis not present

## 2017-08-06 DIAGNOSIS — N61 Mastitis without abscess: Secondary | ICD-10-CM

## 2017-08-06 DIAGNOSIS — O99345 Other mental disorders complicating the puerperium: Principal | ICD-10-CM

## 2017-08-06 MED ORDER — SERTRALINE HCL 50 MG PO TABS: 50.0000 mg | ORAL_TABLET | Freq: Every day | ORAL | 2 refills | Status: DC

## 2017-08-06 MED ORDER — DICLOXACILLIN SODIUM 500 MG PO CAPS: 500.0000 mg | ORAL_CAPSULE | Freq: Four times a day (QID) | ORAL | 1 refills | Status: DC

## 2017-08-06 NOTE — Progress Notes (Signed)
Subjective:     Patient ID: Darlene FastSelena Escobar, female   DOB: [birth year], 41 y.o.   MRN: [id number]  HPI Feels 90% better on SSRI. No anxiety in last few weeks.  Does reports right breast tenderness and redness since yesterday, feels like it did when she had mastitis. States infant not nursing as well as he has a cold and is congested. She also states she is coming down with same cold, nasal congestion and dry cough.  [facility] 08/06/2017 08/06/2017 06/25/2017 12/30/2016 04/16/2015  Decreased Interest 0 0 3 0 0  Down, Depressed, Hopeless 0 0 2 0 0  PHQ - 2 Score 0 0 5 0 0  Altered sleeping 0 0 1 0 -  Tired, decreased energy 1 0 3 1 -  Change in appetite 0 0 3 0 -  Feeling bad or failure about yourself  0 0 3 0 -  Trouble concentrating 0 0 3 0 -  Moving slowly or fidgety/restless 0 0 - 0 -  Suicidal thoughts 0 0 1 0 -  PHQ-9 Score 1 0 19 1 -  Difficult doing work/chores Not difficult at all - Extremely dIfficult - -   Review of Systems Negative except stated above in HPI.    Objective:   Physical Exam A&oX4 WELL GROOMED FEMALE SLIGHTLY SICKLY Blood pressure 122/72, pulse 80, height 5\' 3"  (1.6 m), weight 227 lb 9.6 oz (103.2 kg), currently breastfeeding. Nasal passages boggy and green drainage.  Lungs clear Breasts: left breast normal without mass, skin or nipple changes or axillary nodes, right breast erythema on upper outer portion, no mass noted..    Assessment:     Depression under good control with SSRI Right breast mastitis URI     Plan:     Will continue on current dose of SSRI Dicloxacillin sent in for mastitis RTC as needed.   Lavonia Eager,CNM

## 2017-08-19 ENCOUNTER — Other Ambulatory Visit: Payer: Self-pay | Admitting: Certified Nurse Midwife

## 2017-08-26 ENCOUNTER — Other Ambulatory Visit: Payer: Self-pay

## 2017-09-10 ENCOUNTER — Encounter: Payer: Self-pay | Admitting: Obstetrics and Gynecology

## 2017-09-20 ENCOUNTER — Encounter: Payer: Self-pay | Admitting: Obstetrics and Gynecology

## 2017-10-15 ENCOUNTER — Encounter: Payer: Self-pay | Admitting: Obstetrics and Gynecology

## 2017-10-18 ENCOUNTER — Other Ambulatory Visit: Payer: Self-pay | Admitting: *Deleted

## 2017-10-18 MED ORDER — FLUCONAZOLE 150 MG PO TABS: 150.0000 mg | ORAL_TABLET | Freq: Once | ORAL | 2 refills | Status: AC

## 2017-12-23 ENCOUNTER — Other Ambulatory Visit: Payer: Self-pay | Admitting: *Deleted

## 2017-12-23 ENCOUNTER — Encounter: Payer: Self-pay | Admitting: Obstetrics and Gynecology

## 2017-12-23 MED ORDER — SERTRALINE HCL 100 MG PO TABS: 100.0000 mg | ORAL_TABLET | Freq: Every day | ORAL | 6 refills | Status: DC

## 2018-04-06 ENCOUNTER — Ambulatory Visit
Admission: EM | Admit: 2018-04-06 | Discharge: 2018-04-06 | Disposition: A | Discharge status: Home / Self Care | Payer: BLUE CROSS/BLUE SHIELD | Attending: Family Medicine | Admitting: Family Medicine

## 2018-04-06 ENCOUNTER — Ambulatory Visit (INDEPENDENT_AMBULATORY_CARE_PROVIDER_SITE_OTHER): Payer: BLUE CROSS/BLUE SHIELD

## 2018-04-06 DIAGNOSIS — S39012A Strain of muscle, fascia and tendon of lower back, initial encounter: Secondary | ICD-10-CM | POA: Diagnosis not present

## 2018-04-06 DIAGNOSIS — Z3202 Encounter for pregnancy test, result negative: Secondary | ICD-10-CM

## 2018-04-06 DIAGNOSIS — S29019A Strain of muscle and tendon of unspecified wall of thorax, initial encounter: Secondary | ICD-10-CM | POA: Diagnosis not present

## 2018-04-06 DIAGNOSIS — M546 Pain in thoracic spine: Secondary | ICD-10-CM

## 2018-04-06 DIAGNOSIS — M545 Low back pain: Secondary | ICD-10-CM | POA: Diagnosis not present

## 2018-04-06 LAB — PREGNANCY, URINE: PREG TEST UR: NEGATIVE

## 2018-04-06 MED ORDER — CYCLOBENZAPRINE HCL 10 MG PO TABS: 10.0000 mg | ORAL_TABLET | Freq: Every day | ORAL | 0 refills | Status: DC
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
## 2018-04-06 NOTE — Discharge Instructions (Signed)
Over the counter advil/motrin and/or tylenol as needed for pain

## 2018-05-06 ENCOUNTER — Encounter: Payer: BLUE CROSS/BLUE SHIELD | Admitting: Obstetrics and Gynecology

## 2018-05-10 ENCOUNTER — Encounter: Payer: BLUE CROSS/BLUE SHIELD | Admitting: Obstetrics and Gynecology

## 2018-05-13 ENCOUNTER — Encounter: Payer: BLUE CROSS/BLUE SHIELD | Admitting: Obstetrics and Gynecology

## 2018-06-17 ENCOUNTER — Encounter: Payer: Self-pay | Admitting: Obstetrics and Gynecology

## 2018-06-24 ENCOUNTER — Encounter: Payer: Self-pay | Admitting: Obstetrics and Gynecology

## 2018-06-24 ENCOUNTER — Ambulatory Visit (INDEPENDENT_AMBULATORY_CARE_PROVIDER_SITE_OTHER): Payer: BLUE CROSS/BLUE SHIELD | Admitting: Obstetrics and Gynecology

## 2018-06-24 VITALS — BP 132/84 | HR 88 | Ht 63.0 in | Wt 224.9 lb

## 2018-06-24 DIAGNOSIS — E559 Vitamin D deficiency, unspecified: Secondary | ICD-10-CM | POA: Diagnosis not present

## 2018-06-24 DIAGNOSIS — Z01411 Encounter for gynecological examination (general) (routine) with abnormal findings: Secondary | ICD-10-CM

## 2018-06-24 DIAGNOSIS — E669 Obesity, unspecified: Secondary | ICD-10-CM

## 2018-06-24 DIAGNOSIS — B359 Dermatophytosis, unspecified: Secondary | ICD-10-CM | POA: Diagnosis not present

## 2018-06-24 DIAGNOSIS — L659 Nonscarring hair loss, unspecified: Secondary | ICD-10-CM

## 2018-06-24 MED ORDER — SERTRALINE HCL 100 MG PO TABS: 100.0000 mg | ORAL_TABLET | Freq: Every day | ORAL | 3 refills | Status: DC

## 2018-06-24 MED ORDER — MICONAZOLE NITRATE 2 % EX CREA: 1.0000 "application " | TOPICAL_CREAM | Freq: Two times a day (BID) | CUTANEOUS | 3 refills | Status: DC

## 2018-06-24 NOTE — Patient Instructions (Signed)
Preventive Care 18-39 Years, Female Preventive care refers to lifestyle choices and visits with your health care provider that can promote health and wellness. What does preventive care include?  A yearly physical exam. This is also called an annual well check.  Dental exams once or twice a year.  Routine eye exams. Ask your health care provider how often you should have your eyes checked.  Personal lifestyle choices, including: ? Daily care of your teeth and gums. ? Regular physical activity. ? Eating a healthy diet. ? Avoiding tobacco and drug use. ? Limiting alcohol use. ? Practicing safe sex. ? Taking vitamin and mineral supplements as recommended by your health care provider. What happens during an annual well check? The services and screenings done by your health care provider during your annual well check will depend on your age, overall health, lifestyle risk factors, and family history of disease. Counseling Your health care provider may ask you questions about your:  Alcohol use.  Tobacco use.  Drug use.  Emotional well-being.  Home and relationship well-being.  Sexual activity.  Eating habits.  Work and work Statistician.  Method of birth control.  Menstrual cycle.  Pregnancy history.  Screening You may have the following tests or measurements:  Height, weight, and BMI.  Diabetes screening. This is done by checking your blood sugar (glucose) after you have not eaten for a while (fasting).  Blood pressure.  Lipid and cholesterol levels. These may be checked every 5 years starting at age 38.  Skin check.  Hepatitis C blood test.  Hepatitis B blood test.  Sexually transmitted disease (STD) testing.  BRCA-related cancer screening. This may be done if you have a family history of breast, ovarian, tubal, or peritoneal cancers.  Pelvic exam and Pap test. This may be done every 3 years starting at age 38. Starting at age 30, this may be done  every 5 years if you have a Pap test in combination with an HPV test.  Discuss your test results, treatment options, and if necessary, the need for more tests with your health care provider. Vaccines Your health care provider may recommend certain vaccines, such as:  Influenza vaccine. This is recommended every year.  Tetanus, diphtheria, and acellular pertussis (Tdap, Td) vaccine. You may need a Td booster every 10 years.  Varicella vaccine. You may need this if you have not been vaccinated.  HPV vaccine. If you are 39 or younger, you may need three doses over 6 months.  Measles, mumps, and rubella (MMR) vaccine. You may need at least one dose of MMR. You may also need a second dose.  Pneumococcal 13-valent conjugate (PCV13) vaccine. You may need this if you have certain conditions and were not previously vaccinated.  Pneumococcal polysaccharide (PPSV23) vaccine. You may need one or two doses if you smoke cigarettes or if you have certain conditions.  Meningococcal vaccine. One dose is recommended if you are age 68-21 years and a first-year college student living in a residence hall, or if you have one of several medical conditions. You may also need additional booster doses.  Hepatitis A vaccine. You may need this if you have certain conditions or if you travel or work in places where you may be exposed to hepatitis A.  Hepatitis B vaccine. You may need this if you have certain conditions or if you travel or work in places where you may be exposed to hepatitis B.  Haemophilus influenzae type b (Hib) vaccine. You may need this  if you have certain risk factors.  Talk to your health care provider about which screenings and vaccines you need and how often you need them. This information is not intended to replace advice given to you by your health care provider. Make sure you discuss any questions you have with your health care provider. Document Released: 10/13/2001 Document Revised:  05/06/2016 Document Reviewed: 06/18/2015 Elsevier Interactive Patient Education  2018 Elsevier Inc.  

## 2018-06-24 NOTE — Progress Notes (Signed)
Subjective:   Darlene Escobar is a 42 y.o. G27P2002 Caucasian female here for a routine well-woman exam.  Patient's last menstrual period was 06/07/2018.    Current complaints: fatigue daily, sleeping fine, but wakes up exhausted, hair is still falling out. Sill nursing 19 mo son.  PCP: me       does desire labs  Social History: Sexual: heterosexual Marital Status: married Living situation: with family Occupation: homemaker Tobacco/alcohol: no tobacco use Illicit drugs: no history of illicit drug use  The following portions of the patient's history were reviewed and updated as appropriate: allergies, current medications, past family history, past medical history, past social history, past surgical history and problem list.  Past Medical History Past Medical History:  Diagnosis Date  . Mild asthma     Past Surgical History Past Surgical History:  Procedure Laterality Date  . TONSILLECTOMY      Gynecologic History [id number]  Patient's last menstrual period was 06/07/2018. Contraception: condoms Last Pap: 2018. Results were: normal   Obstetric History OB History  Gravida Para Term Preterm AB Living  2 2 2     2   SAB TAB Ectopic Multiple Live Births        0 2    # Outcome Date GA Lbr Len/2nd Weight Sex Delivery Anes PTL Lv  2 Term 11/12/16 [redacted]w[redacted]d  8 lb 13.8 oz (4.02 kg) M Vag-Spont EPI  LIV  1 Term 03/05/15 [redacted]w[redacted]d / 00:17 8 lb 8.5 oz (3.87 kg) M Vag-Spont None  LIV    Current Medications Current Outpatient Medications on File Prior to Visit  Medication Sig Dispense Refill  . albuterol (PROVENTIL HFA;VENTOLIN HFA) 108 (90 Base) MCG/ACT inhaler Inhale into the lungs every 6 (six) hours as needed for wheezing or shortness of breath.    . B Complex Vitamins (B COMPLEX 1 PO) Take by mouth.    . sertraline (ZOLOFT) 100 MG tablet Take 1 tablet (100 mg total) by mouth daily. 30 tablet 6  . cyclobenzaprine (FLEXERIL) 10 MG tablet Take 1 tablet (10 mg total) by mouth at bedtime.  (Patient not taking: Reported on 06/24/2018) 30 tablet 0  . Vitamin D, Ergocalciferol, (DRISDOL) 50000 units CAPS capsule Take 1 capsule (50,000 Units total) by mouth 2 (two) times a week. (Patient not taking: Reported on 06/24/2018) 30 capsule 1   No current facility-administered medications on file prior to visit.     Review of Systems Patient denies any headaches, blurred vision, shortness of breath, chest pain, abdominal pain, problems with bowel movements, urination, or intercourse.  Objective:  BP 132/84   Pulse 88   Ht 5\' 3"  (1.6 m)   Wt 224 lb 14.4 oz (102 kg)   LMP 06/07/2018   BMI 39.84 kg/m  Physical Exam  General:  Well developed, well nourished, no acute distress. She is alert and oriented x3. Skin:  Warm and dry, with tinea rash noted under each breast, abdominal fold and groin. Neck:  Midline trachea, no thyromegaly or nodules Cardiovascular: Regular rate and rhythm, no murmur heard Lungs:  Effort normal, all lung fields clear to auscultation bilaterally Breasts:  No dominant palpable mass, retraction, or nipple discharge Abdomen:  Soft, non tender, no hepatosplenomegaly or masses Pelvic:  External genitalia is normal in appearance.  The vagina is normal in appearance. The cervix is bulbous, no CMT.  Thin prep pap is not done . Uterus is felt to be normal size, shape, and contour.  No adnexal masses or tenderness noted. Extremities:  No swelling or varicosities noted Psych:  She has a normal mood and affect  Assessment:   Healthy well-woman exam Tinea Hair loss Obesity Vitamin d deficiency   Plan:  Labs obtained-will follow up accordingly.  Miconazole cream bid to affected areas x 7-14 days and as needed. F/U 1 year for AE, or sooner if needed Mammogram ordered  Melody Suzan Nailer, CNM

## 2018-06-25 LAB — LIPID PANEL
CHOL/HDL RATIO: 3.5 ratio (ref 0.0–4.4)
Cholesterol, Total: 197 mg/dL (ref 100–199)
HDL: 57 mg/dL (ref >39)
LDL Calculated: 110 mg/dL — ABNORMAL HIGH (ref 0–99)
Triglycerides: 151 mg/dL — ABNORMAL HIGH (ref 0–149)
VLDL Cholesterol Cal: 30 mg/dL (ref 5–40)

## 2018-06-25 LAB — THYROID PANEL WITH TSH
FREE THYROXINE INDEX: 1.6 (ref 1.2–4.9)
T3 UPTAKE RATIO: 24 % (ref 24–39)
T4 TOTAL: 6.5 ug/dL (ref 4.5–12.0)
TSH: 1.8 u[IU]/mL (ref 0.450–4.500)

## 2018-06-25 LAB — COMPREHENSIVE METABOLIC PANEL
ALBUMIN: 4.7 g/dL (ref 3.5–5.5)
ALT: 15 IU/L (ref 0–32)
AST: 16 IU/L (ref 0–40)
Albumin/Globulin Ratio: 2.1 (ref 1.2–2.2)
Alkaline Phosphatase: 63 IU/L (ref 39–117)
BUN / CREAT RATIO: 18 (ref 9–23)
BUN: 12 mg/dL (ref 6–24)
Bilirubin Total: 0.2 mg/dL (ref 0.0–1.2)
CO2: 21 mmol/L (ref 20–29)
CREATININE: 0.66 mg/dL (ref 0.57–1.00)
Calcium: 9.5 mg/dL (ref 8.7–10.2)
Chloride: 104 mmol/L (ref 96–106)
GFR calc non Af Amer: 109 mL/min/{1.73_m2} (ref >59)
GFR, EST AFRICAN AMERICAN: 126 mL/min/{1.73_m2} (ref >59)
GLUCOSE: 94 mg/dL (ref 65–99)
Globulin, Total: 2.2 g/dL (ref 1.5–4.5)
Potassium: 4 mmol/L (ref 3.5–5.2)
Sodium: 140 mmol/L (ref 134–144)
TOTAL PROTEIN: 6.9 g/dL (ref 6.0–8.5)

## 2018-06-25 LAB — VITAMIN D 25 HYDROXY (VIT D DEFICIENCY, FRACTURES): VIT D 25 HYDROXY: 22.4 ng/mL — AB (ref 30.0–100.0)

## 2018-06-28 ENCOUNTER — Other Ambulatory Visit: Payer: Self-pay | Admitting: Obstetrics and Gynecology

## 2018-06-28 MED ORDER — VITAMIN D (ERGOCALCIFEROL) 1.25 MG (50000 UNIT) PO CAPS: 50000.0000 [IU] | ORAL_CAPSULE | ORAL | 1 refills | Status: DC

## 2018-07-06 ENCOUNTER — Encounter: Payer: Self-pay | Admitting: Emergency Medicine

## 2018-07-06 ENCOUNTER — Emergency Department: Payer: BLUE CROSS/BLUE SHIELD

## 2018-07-06 ENCOUNTER — Other Ambulatory Visit: Payer: Self-pay

## 2018-07-06 ENCOUNTER — Emergency Department
Admission: EM | Admit: 2018-07-06 | Discharge: 2018-07-06 | Disposition: A | Discharge status: Home / Self Care | Payer: BLUE CROSS/BLUE SHIELD | Attending: Emergency Medicine | Admitting: Emergency Medicine

## 2018-07-06 DIAGNOSIS — Y999 Unspecified external cause status: Secondary | ICD-10-CM | POA: Diagnosis not present

## 2018-07-06 DIAGNOSIS — Y929 Unspecified place or not applicable: Secondary | ICD-10-CM | POA: Diagnosis not present

## 2018-07-06 DIAGNOSIS — Y9301 Activity, walking, marching and hiking: Secondary | ICD-10-CM | POA: Insufficient documentation

## 2018-07-06 DIAGNOSIS — S82842A Displaced bimalleolar fracture of left lower leg, initial encounter for closed fracture: Secondary | ICD-10-CM

## 2018-07-06 DIAGNOSIS — W108XXA Fall (on) (from) other stairs and steps, initial encounter: Secondary | ICD-10-CM | POA: Insufficient documentation

## 2018-07-06 DIAGNOSIS — Z79899 Other long term (current) drug therapy: Secondary | ICD-10-CM | POA: Diagnosis not present

## 2018-07-06 DIAGNOSIS — S82851A Displaced trimalleolar fracture of right lower leg, initial encounter for closed fracture: Secondary | ICD-10-CM | POA: Diagnosis not present

## 2018-07-06 DIAGNOSIS — J45909 Unspecified asthma, uncomplicated: Secondary | ICD-10-CM | POA: Diagnosis not present

## 2018-07-06 DIAGNOSIS — S99911A Unspecified injury of right ankle, initial encounter: Secondary | ICD-10-CM | POA: Diagnosis present

## 2018-07-06 DIAGNOSIS — S8262XA Displaced fracture of lateral malleolus of left fibula, initial encounter for closed fracture: Secondary | ICD-10-CM | POA: Diagnosis not present

## 2018-07-06 MED ORDER — OXYCODONE-ACETAMINOPHEN 5-325 MG PO TABS: 1.0000 | ORAL_TABLET | Freq: Three times a day (TID) | ORAL | 0 refills | Status: AC | PRN

## 2018-07-06 MED ORDER — OXYCODONE-ACETAMINOPHEN 5-325 MG PO TABS
1.0000 | ORAL_TABLET | Freq: Once | ORAL | Status: AC
  Administered 2018-07-06: 1 via ORAL
  Filled 2018-07-06: qty 1

## 2018-07-06 NOTE — ED Provider Notes (Signed)
[facility] Emergency Department Provider Note  ____________________________________________  Time seen: Approximately 6:22 PM  I have reviewed the triage vital signs and the nursing notes.   HISTORY  Chief Complaint Ankle Pain    HPI Darlene Escobar is a 42 y.o. female presents to the emergency department with bilateral ankle pain.  Patient was ambulating down steps.  Patient was 2 steps from the bottom when she lost her footing and fell.  Patient reports 10 out of 10 right ankle pain at 8 out of 10 left ankle pain.  She denies prior ankle sprains in the past.  She did not hit her head or neck during the fall.  Patient denies chest pain, chest tightness or abdominal pain.  No nausea or vomiting.  Patient denies numbness or tingling in the lower extremities or loss of sensation.  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  She does not work outside the home.    Past Medical History:  Diagnosis Date  . Mild asthma     Patient Active Problem List   Diagnosis Date Noted  . Vitamin D deficiency 05/06/2017  . Obesity (BMI 30-39.9) 12/31/2015  . ASCUS favor benign 08/21/2015    Past Surgical History:  Procedure Laterality Date  . TONSILLECTOMY      Prior to Admission medications   Medication Sig Start Date End Date Taking? Authorizing Provider  albuterol (PROVENTIL HFA;VENTOLIN HFA) 108 (90 Base) MCG/ACT inhaler Inhale into the lungs every 6 (six) hours as needed for wheezing or shortness of breath.    [provider]  B Complex Vitamins (B COMPLEX 1 PO) Take by mouth.    [provider]  cyclobenzaprine (FLEXERIL) 10 MG tablet Take 1 tablet (10 mg total) by mouth at bedtime. Patient not taking: Reported on 06/24/2018 04/06/18   Payton Mccallum, MD  miconazole (MICATIN) 2 % cream Apply 1 application topically 2 (two) times daily. 06/24/18   Shambley, Melody N, CNM  sertraline (ZOLOFT) 100 MG tablet Take 1 tablet (100 mg total) by  mouth daily. 06/24/18   Shambley, Melody N, CNM  Vitamin D, Ergocalciferol, (DRISDOL) 50000 units CAPS capsule Take 1 capsule (50,000 Units total) by mouth 2 (two) times a week. 06/30/18   Purcell Nails, CNM    Allergies Patient has no known allergies.  Family History  Problem Relation Age of Onset  . Diabetes Mother   . Kidney cancer Mother   . Heart disease Father   . Cancer Neg Hx     Social History Social History   Tobacco Use  . Smoking status: Never Smoker  . Smokeless tobacco: Never Used  Substance Use Topics  . Alcohol use: No  . Drug use: No     Review of Systems  Constitutional: No fever/chills Eyes: No visual changes. No discharge ENT: No upper respiratory complaints. Cardiovascular: no chest pain. Respiratory: no cough. No SOB. Gastrointestinal: No abdominal pain.  No nausea, no vomiting.  No diarrhea.  No constipation. Genitourinary: Negative for dysuria. No hematuria Musculoskeletal: Patient has bilateral ankle pain.  Skin: Negative for rash, abrasions, lacerations, ecchymosis. Neurological: Negative for headaches, focal weakness or numbness.  ____________________________________________   PHYSICAL EXAM:  VITAL SIGNS: ED Triage Vitals  Enc Vitals Group     BP 07/06/18 1538 (!) 129/97     Pulse Rate 07/06/18 1538 79     Resp 07/06/18 1538 16     Temp 07/06/18 1538 97.9 F (36.6 C)     Temp Source  07/06/18 1538 Oral     SpO2 07/06/18 1538 100 %     Weight 07/06/18 1539 224 lb 13.9 oz (102 kg)     Height 07/06/18 1539 5\' 3"  (1.6 m)     Head Circumference --      Peak Flow --      Pain Score 07/06/18 1539 (S) 5     Pain Loc --      Pain Edu? --      Excl. in GC? --      Constitutional: Alert and oriented. Well appearing and in no acute distress. Eyes: Conjunctivae are normal. PERRL. EOMI. Head: Atraumatic.  Cardiovascular: Normal rate, regular rhythm. Normal S1 and S2.  Good peripheral circulation. Respiratory: Normal respiratory  effort without tachypnea or retractions. Lungs CTAB. Good air entry to the bases with no decreased or absent breath sounds. Musculoskeletal: Patient is unable to perform full range of motion of the bilateral ankles.  Patient has tenderness circumferentially around right ankle.  Patient only has tenderness to palpation over the lateral malleolus on the left.  Patient has capillary refill less than 2 seconds bilaterally and palpable dorsalis pedis pulse bilaterally and symmetrically. Neurologic:  Normal speech and language. No gross focal neurologic deficits are appreciated.  Skin:  Skin is warm, dry and intact. No rash noted. Psychiatric: Mood and affect are normal. Speech and behavior are normal. Patient exhibits appropriate insight and judgement.   ____________________________________________   LABS (all labs ordered are listed, but only abnormal results are displayed)  Labs Reviewed - No data to display ____________________________________________  EKG   ____________________________________________  RADIOLOGY I personally viewed and evaluated these images as part of my medical decision making, as well as reviewing the written report by the radiologist  Dg Ankle Complete Left  Result Date: 07/06/2018 CLINICAL DATA:  Fall.  Ankle pain. EXAM: LEFT ANKLE COMPLETE - 3+ VIEW COMPARISON:  No recent prior. FINDINGS: Slightly displaced left lateral malleolar fracture present. Medial malleolus is intact. No other focal abnormality identified. IMPRESSION: Slightly displaced left lateral malleolar fracture. Electronically Signed   By: Maisie Fus  Register   On: 07/06/2018 16:28   Dg Ankle Complete Right  Result Date: 07/06/2018 CLINICAL DATA:  Right ankle pain after fall. EXAM: RIGHT ANKLE - COMPLETE 3+ VIEW COMPARISON:  None. FINDINGS: Acute, nondisplaced transverse fracture of the medial malleolus. Acute, mildly displaced oblique fracture of the distal fibula. Acute, mildly distracted fracture of  the posterior malleolus. The ankle mortise is symmetric. The talar dome is intact. Joint spaces are preserved. Bone mineralization is normal. Diffuse soft tissue swelling about the ankle. IMPRESSION: Acute trimalleolar fracture as described above. Electronically Signed   By: Obie Dredge M.D.   On: 07/06/2018 16:32    ____________________________________________    PROCEDURES  Procedure(s) performed:    Procedures    Medications  oxyCODONE-acetaminophen (PERCOCET/ROXICET) 5-325 MG per tablet 1 tablet (1 tablet Oral Given 07/06/18 1725)     ____________________________________________   INITIAL IMPRESSION / ASSESSMENT AND PLAN / ED COURSE  Pertinent labs & imaging results that were available during my care of the patient were reviewed by me and considered in my medical decision making (see chart for details).  Review of the [place] CSRS was performed in accordance of the [facility] prior to dispensing any controlled drugs.      Assessment and Plan:  Fall Patient presents to the emergency department with bilateral ankle pain after a fall down 2 steps.  X-ray examination shows a trimalleolar fracture on  the right and a lateral malleolar fracture on the left.  Reduction is not warranted for either lower extremity.  Dr. Martha Clan was consulted given bilateral ankle fractures.  Dr. Martha Clan advised splinting the right and leaving the left for assisted ambulation.  Dr. Martha Clan reviewed x-rays and conveyed that because fracture occurs below the joint line, it is stable enough for assisted ambulation.  Patient was advised to contact Dr. Samuel Germany office in the morning for appointment.  Crutches were provided.  A brief course of Roxicet was prescribed for pain.  Patient is currently breast-feeding and was advised to abstain from breast-feeding while taking Percocet.    ____________________________________________  FINAL CLINICAL IMPRESSION(S) / ED DIAGNOSES  Final diagnoses:  None       NEW MEDICATIONS STARTED DURING THIS VISIT:  ED Discharge Orders    None          This chart was dictated using voice recognition software/Dragon. Despite best efforts to proofread, errors can occur which can change the meaning. Any change was purely unintentional.    Orvil Feil, PA-C 07/06/18 1839    Minna Antis, MD 07/06/18 2258

## 2018-07-06 NOTE — Consult Note (Signed)
Called by ER PA to review xrays of both ankles on Darlene Escobar.  Patient has a left distal fibular fracture below the joint line with minimal displacement.   This should be ok to weightbear on as patient can tolerate without risk of displacement.  The right ankle has a minimally displaced trimalleolar fracture.   I have recommended splinting the right ankle and the patient is to be instructed on NWB on the right side.  Patient will follow up in our office in the next few days for re-evaluation and to discuss definitive treatment for her right ankle.

## 2018-07-06 NOTE — ED Triage Notes (Signed)
Says she was carrying something to her building going down 2 steps and fell forward.  Says both ankles are painful now. Didn't hit head.

## 2018-07-06 NOTE — ED Notes (Signed)
See triage note  Presents s/p fall   States she missed a step fell  Injury to both ankles  Increased pain with standing  Good pulses

## 2018-07-08 ENCOUNTER — Other Ambulatory Visit: Payer: Self-pay | Admitting: Orthopedic Surgery

## 2018-07-13 ENCOUNTER — Other Ambulatory Visit: Payer: Self-pay

## 2018-07-13 ENCOUNTER — Encounter
Admission: RE | Admit: 2018-07-13 | Discharge: 2018-07-13 | Disposition: A | Discharge status: Home / Self Care | Payer: BLUE CROSS/BLUE SHIELD | Source: Ambulatory Visit | Attending: Orthopedic Surgery | Admitting: Orthopedic Surgery

## 2018-07-13 HISTORY — DX: Anemia, unspecified: D64.9

## 2018-07-13 HISTORY — DX: Anxiety disorder, unspecified: F41.9

## 2018-07-13 HISTORY — DX: Gastro-esophageal reflux disease without esophagitis: K21.9

## 2018-07-13 NOTE — Patient Instructions (Addendum)
Your procedure is scheduled on: 07-15-18 Report to Same Day Surgery 2nd floor medical mall [facility](Medical Mall Entrance-take elevator on left to 2nd floor.  Check in with surgery information desk.) To find out your arrival time please call [phone] between 1PM - 3PM on 07-14-18  Remember: Instructions that are not followed completely may result in serious medical risk, up to and including death, or upon the discretion of your surgeon and anesthesiologist your surgery may need to be rescheduled.    _x___ 1. Do not eat food after midnight the night before your procedure. You may drink clear liquids up to 2 hours before you are scheduled to arrive at the hospital for your procedure.  Do not drink clear liquids within 2 hours of your scheduled arrival to the hospital.  Clear liquids include  --Water or Apple juice without pulp  --Clear carbohydrate beverage such as ClearFast or Gatorade  --Black Coffee or Clear Tea (No milk, no creamers, do not add anything to the coffee or Tea   ____Ensure clear carbohydrate drink on the way to the hospital for bariatric patients  ____Ensure clear carbohydrate drink 3 hours before surgery for Dr Rutherford NailByrnett's patients if physician instructed.   No gum chewing or hard candies.     __x__ 2. No Alcohol for 24 hours before or after surgery.   __x__3. No Smoking or e-cigarettes for 24 prior to surgery.  Do not use any chewable tobacco products for at least 6 hour prior to surgery   ____  4. Bring all medications with you on the day of surgery if instructed.    __x__ 5. Notify your doctor if there is any change in your medical condition     (cold, fever, infections).    x___6. On the morning of surgery brush your teeth with toothpaste and water.  You may rinse your mouth with mouth wash if you wish.  Do not swallow any toothpaste or mouthwash.   Do not wear jewelry, make-up, hairpins, clips or nail polish.  Do not wear lotions, powders, or perfumes. You may wear  deodorant.  Do not shave 48 hours prior to surgery. Men may shave face and neck.  Do not bring valuables to the hospital.    [facility] is not responsible for any belongings or valuables.               Contacts, dentures or bridgework may not be worn into surgery.  Leave your suitcase in the car. After surgery it may be brought to your room.  For patients admitted to the hospital, discharge time is determined by your treatment team.  _  Patients discharged the day of surgery will not be allowed to drive home.  You will need someone to drive you home and stay with you the night of your procedure.    Please read over the following fact sheets that you were given:   [facility] Preparing for [facility]   _x___ TAKE THE FOLLOWING MEDICATION THE MORNING OF SURGERY WITH A SMALL SIP OF WATER. These include:  1. ZOLOFT  2. YOU MAY TAKE HYDROCODONE DAY OF SURGERY IF NEEDED  3.  4.  5.  6.  ____Fleets enema or Magnesium Citrate as directed.   ____ Use CHG Soap or sage wipes as directed on instruction sheet   _X___ Use inhalers on the day of surgery and bring to hospital day of surgery-USE ALBUTEROL INHALER AND BRING TO HOSPITAL  ____ Stop Metformin and Janumet 2 days prior to  surgery.    ____ Take 1/2 of usual insulin dose the night before surgery and none on the morning surgery.   ____ Follow recommendations from Cardiologist, Pulmonologist or PCP regarding stopping Aspirin, Coumadin, Plavix ,Eliquis, Effient, or Pradaxa, and Pletal.  X____Stop Anti-inflammatories such as Advil, Aleve, Ibuprofen, Motrin, Naproxen, Naprosyn, Goodies powders or aspirin products NOW-OK to take Tylenol .   ____ Stop supplements until after surgery.     ____ Bring C-Pap to the hospital.

## 2018-07-14 MED ORDER — CEFAZOLIN SODIUM-DEXTROSE 2-4 GM/100ML-% IV SOLN
2.0000 g | INTRAVENOUS | Status: AC
  Administered 2018-07-15: 2 g via INTRAVENOUS

## 2018-07-15 ENCOUNTER — Ambulatory Visit
Admission: RE | Admit: 2018-07-15 | Discharge: 2018-07-15 | Disposition: A | Discharge status: Home / Self Care | Payer: BLUE CROSS/BLUE SHIELD | Source: Ambulatory Visit | Attending: Orthopedic Surgery | Admitting: Orthopedic Surgery

## 2018-07-15 ENCOUNTER — Encounter: Payer: Self-pay | Admitting: *Deleted

## 2018-07-15 ENCOUNTER — Observation Stay
Admission: RE | Admit: 2018-07-15 | Discharge: 2018-07-16 | Disposition: A | Discharge status: Home / Self Care | Payer: BLUE CROSS/BLUE SHIELD | Source: Ambulatory Visit | Attending: Orthopedic Surgery | Admitting: Orthopedic Surgery

## 2018-07-15 ENCOUNTER — Encounter
Admission: RE | Disposition: A | Discharge status: Home / Self Care | Payer: Self-pay | Source: Ambulatory Visit | Attending: Orthopedic Surgery

## 2018-07-15 ENCOUNTER — Ambulatory Visit: Payer: BLUE CROSS/BLUE SHIELD | Admitting: Certified Registered Nurse Anesthetist

## 2018-07-15 ENCOUNTER — Other Ambulatory Visit: Payer: Self-pay

## 2018-07-15 DIAGNOSIS — Z79899 Other long term (current) drug therapy: Secondary | ICD-10-CM | POA: Insufficient documentation

## 2018-07-15 DIAGNOSIS — S82851A Displaced trimalleolar fracture of right lower leg, initial encounter for closed fracture: Principal | ICD-10-CM | POA: Insufficient documentation

## 2018-07-15 DIAGNOSIS — Z87891 Personal history of nicotine dependence: Secondary | ICD-10-CM | POA: Diagnosis not present

## 2018-07-15 DIAGNOSIS — F419 Anxiety disorder, unspecified: Secondary | ICD-10-CM | POA: Insufficient documentation

## 2018-07-15 DIAGNOSIS — W19XXXA Unspecified fall, initial encounter: Secondary | ICD-10-CM | POA: Diagnosis not present

## 2018-07-15 DIAGNOSIS — S82891A Other fracture of right lower leg, initial encounter for closed fracture: Secondary | ICD-10-CM

## 2018-07-15 DIAGNOSIS — K219 Gastro-esophageal reflux disease without esophagitis: Secondary | ICD-10-CM | POA: Diagnosis not present

## 2018-07-15 DIAGNOSIS — Z7982 Long term (current) use of aspirin: Secondary | ICD-10-CM | POA: Diagnosis not present

## 2018-07-15 DIAGNOSIS — S82841A Displaced bimalleolar fracture of right lower leg, initial encounter for closed fracture: Secondary | ICD-10-CM | POA: Diagnosis present

## 2018-07-15 HISTORY — PX: ORIF ANKLE FRACTURE: SHX5408

## 2018-07-15 LAB — BASIC METABOLIC PANEL
Anion gap: 10 (ref 5–15)
BUN: 10 mg/dL (ref 6–20)
CALCIUM: 9.1 mg/dL (ref 8.9–10.3)
CHLORIDE: 105 mmol/L (ref 98–111)
CO2: 23 mmol/L (ref 22–32)
CREATININE: 0.7 mg/dL (ref 0.44–1.00)
GFR calc Af Amer: >60 mL/min (ref >60)
Glucose, Bld: 90 mg/dL (ref 70–99)
Potassium: 4 mmol/L (ref 3.5–5.1)
SODIUM: 138 mmol/L (ref 135–145)

## 2018-07-15 LAB — CBC WITH DIFFERENTIAL/PLATELET
Abs Immature Granulocytes: 0.04 10*3/uL (ref 0.00–0.07)
Basophils Absolute: 0 10*3/uL (ref 0.0–0.1)
Basophils Relative: 0 %
EOS ABS: 0.2 10*3/uL (ref 0.0–0.5)
EOS PCT: 2 %
HCT: 37.2 % (ref 36.0–46.0)
HEMOGLOBIN: 12.6 g/dL (ref 12.0–15.0)
Immature Granulocytes: 1 %
LYMPHS PCT: 35 %
Lymphs Abs: 2.5 10*3/uL (ref 0.7–4.0)
MCH: 31.9 pg (ref 26.0–34.0)
MCHC: 33.9 g/dL (ref 30.0–36.0)
MCV: 94.2 fL (ref 80.0–100.0)
MONO ABS: 0.5 10*3/uL (ref 0.1–1.0)
Monocytes Relative: 7 %
Neutro Abs: 4 10*3/uL (ref 1.7–7.7)
Neutrophils Relative %: 55 %
Platelets: 277 10*3/uL (ref 150–400)
RBC: 3.95 MIL/uL (ref 3.87–5.11)
RDW: 12.3 % (ref 11.5–15.5)
WBC: 7.3 10*3/uL (ref 4.0–10.5)
nRBC: 0 % (ref 0.0–0.2)

## 2018-07-15 LAB — APTT: APTT: 32 s (ref 24–36)

## 2018-07-15 LAB — POCT PREGNANCY, URINE: Preg Test, Ur: NEGATIVE

## 2018-07-15 LAB — PROTIME-INR
INR: 0.94
PROTHROMBIN TIME: 12.5 s (ref 11.4–15.2)

## 2018-07-15 SURGERY — OPEN REDUCTION INTERNAL FIXATION (ORIF) ANKLE FRACTURE
Anesthesia: General | Site: Ankle | Laterality: Right

## 2018-07-15 MED ORDER — PHENOL 1.4 % MT LIQD
1.0000 | OROMUCOSAL | Status: DC | PRN
  Filled 2018-07-15: qty 177

## 2018-07-15 MED ORDER — MIDAZOLAM HCL 2 MG/2ML IJ SOLN
INTRAMUSCULAR | Status: AC
  Filled 2018-07-15: qty 2

## 2018-07-15 MED ORDER — FAMOTIDINE 20 MG PO TABS
20.0000 mg | ORAL_TABLET | Freq: Once | ORAL | Status: AC
  Administered 2018-07-15: 20 mg via ORAL

## 2018-07-15 MED ORDER — FENTANYL CITRATE (PF) 100 MCG/2ML IJ SOLN
25.0000 ug | INTRAMUSCULAR | Status: AC | PRN
  Administered 2018-07-15 (×6): 25 ug via INTRAVENOUS

## 2018-07-15 MED ORDER — ASPIRIN EC 325 MG PO TBEC: 325.0000 mg | DELAYED_RELEASE_TABLET | Freq: Two times a day (BID) | ORAL | 0 refills | Status: DC

## 2018-07-15 MED ORDER — BISACODYL 10 MG RE SUPP: 10.0000 mg | Freq: Every day | RECTAL | Status: DC | PRN

## 2018-07-15 MED ORDER — CHLORHEXIDINE GLUCONATE CLOTH 2 % EX PADS: 6.0000 | MEDICATED_PAD | Freq: Once | CUTANEOUS | Status: DC

## 2018-07-15 MED ORDER — KETOROLAC TROMETHAMINE 15 MG/ML IJ SOLN
15.0000 mg | Freq: Four times a day (QID) | INTRAMUSCULAR | Status: DC
  Administered 2018-07-15 – 2018-07-16 (×3): 15 mg via INTRAVENOUS
  Filled 2018-07-15 (×3): qty 1

## 2018-07-15 MED ORDER — GABAPENTIN 300 MG PO CAPS
300.0000 mg | ORAL_CAPSULE | Freq: Three times a day (TID) | ORAL | Status: DC
  Administered 2018-07-15 – 2018-07-16 (×2): 300 mg via ORAL
  Filled 2018-07-15 (×2): qty 1

## 2018-07-15 MED ORDER — ONDANSETRON HCL 4 MG/2ML IJ SOLN
INTRAMUSCULAR | Status: DC | PRN
  Administered 2018-07-15: 4 mg via INTRAVENOUS

## 2018-07-15 MED ORDER — FENTANYL CITRATE (PF) 100 MCG/2ML IJ SOLN
INTRAMUSCULAR | Status: AC
  Filled 2018-07-15: qty 2

## 2018-07-15 MED ORDER — SERTRALINE HCL 50 MG PO TABS
100.0000 mg | ORAL_TABLET | ORAL | Status: DC
  Administered 2018-07-16: 100 mg via ORAL
  Filled 2018-07-15: qty 2

## 2018-07-15 MED ORDER — POTASSIUM CHLORIDE IN NACL 20-0.9 MEQ/L-% IV SOLN
INTRAVENOUS | Status: DC
  Administered 2018-07-15: 22:00:00 via INTRAVENOUS
  Filled 2018-07-15 (×4): qty 1000

## 2018-07-15 MED ORDER — DEXAMETHASONE SODIUM PHOSPHATE 10 MG/ML IJ SOLN
INTRAMUSCULAR | Status: AC
  Filled 2018-07-15: qty 1

## 2018-07-15 MED ORDER — BUPIVACAINE HCL (PF) 0.25 % IJ SOLN
INTRAMUSCULAR | Status: DC | PRN
  Administered 2018-07-15: 30 mL

## 2018-07-15 MED ORDER — LACTATED RINGERS IV SOLN
INTRAVENOUS | Status: DC
  Administered 2018-07-15: 14:00:00 via INTRAVENOUS

## 2018-07-15 MED ORDER — CEFAZOLIN SODIUM-DEXTROSE 2-4 GM/100ML-% IV SOLN
2.0000 g | Freq: Four times a day (QID) | INTRAVENOUS | Status: AC
  Administered 2018-07-15 – 2018-07-16 (×2): 2 g via INTRAVENOUS
  Filled 2018-07-15 (×2): qty 100

## 2018-07-15 MED ORDER — SUCCINYLCHOLINE CHLORIDE 20 MG/ML IJ SOLN
INTRAMUSCULAR | Status: DC | PRN
  Administered 2018-07-15: 100 mg via INTRAVENOUS

## 2018-07-15 MED ORDER — METHOCARBAMOL 500 MG PO TABS: 500.0000 mg | ORAL_TABLET | Freq: Four times a day (QID) | ORAL | Status: DC | PRN

## 2018-07-15 MED ORDER — DOCUSATE SODIUM 100 MG PO CAPS
100.0000 mg | ORAL_CAPSULE | Freq: Two times a day (BID) | ORAL | Status: DC
  Administered 2018-07-15 – 2018-07-16 (×2): 100 mg via ORAL
  Filled 2018-07-15 (×2): qty 1

## 2018-07-15 MED ORDER — SUGAMMADEX SODIUM 200 MG/2ML IV SOLN
INTRAVENOUS | Status: AC
  Filled 2018-07-15: qty 2

## 2018-07-15 MED ORDER — TRAMADOL HCL 50 MG PO TABS
50.0000 mg | ORAL_TABLET | Freq: Four times a day (QID) | ORAL | Status: DC
  Administered 2018-07-15 – 2018-07-16 (×4): 50 mg via ORAL
  Filled 2018-07-15 (×4): qty 1

## 2018-07-15 MED ORDER — MIDAZOLAM HCL 2 MG/2ML IJ SOLN
INTRAMUSCULAR | Status: DC | PRN
  Administered 2018-07-15: 2 mg via INTRAVENOUS

## 2018-07-15 MED ORDER — LIDOCAINE HCL (PF) 2 % IJ SOLN
INTRAMUSCULAR | Status: AC
  Filled 2018-07-15: qty 10

## 2018-07-15 MED ORDER — NEOMYCIN-POLYMYXIN B GU 40-200000 IR SOLN
Status: AC
  Filled 2018-07-15: qty 20

## 2018-07-15 MED ORDER — ONDANSETRON HCL 4 MG PO TABS: 4.0000 mg | ORAL_TABLET | Freq: Three times a day (TID) | ORAL | 0 refills | Status: DC | PRN

## 2018-07-15 MED ORDER — VITAMIN D (ERGOCALCIFEROL) 1.25 MG (50000 UNIT) PO CAPS: 50000.0000 [IU] | ORAL_CAPSULE | ORAL | Status: DC

## 2018-07-15 MED ORDER — MAGNESIUM CITRATE PO SOLN
1.0000 | Freq: Once | ORAL | Status: DC | PRN
  Filled 2018-07-15: qty 296

## 2018-07-15 MED ORDER — ARTIFICIAL TEARS OPHTHALMIC OINT
TOPICAL_OINTMENT | OPHTHALMIC | Status: DC | PRN
  Administered 2018-07-15: 1 via OPHTHALMIC

## 2018-07-15 MED ORDER — DIPHENHYDRAMINE HCL 50 MG/ML IJ SOLN
INTRAMUSCULAR | Status: AC
  Filled 2018-07-15: qty 1

## 2018-07-15 MED ORDER — OXYCODONE HCL 5 MG PO TABS
5.0000 mg | ORAL_TABLET | ORAL | Status: DC | PRN
  Filled 2018-07-15 (×2): qty 2

## 2018-07-15 MED ORDER — ONDANSETRON HCL 4 MG/2ML IJ SOLN: 4.0000 mg | Freq: Once | INTRAMUSCULAR | Status: DC | PRN

## 2018-07-15 MED ORDER — PROPOFOL 10 MG/ML IV BOLUS
INTRAVENOUS | Status: AC
  Filled 2018-07-15: qty 20

## 2018-07-15 MED ORDER — FAMOTIDINE 20 MG PO TABS
ORAL_TABLET | ORAL | Status: AC
  Administered 2018-07-15: 20 mg via ORAL
  Filled 2018-07-15: qty 1

## 2018-07-15 MED ORDER — ROCURONIUM BROMIDE 50 MG/5ML IV SOLN
INTRAVENOUS | Status: AC
  Filled 2018-07-15: qty 1

## 2018-07-15 MED ORDER — SUGAMMADEX SODIUM 200 MG/2ML IV SOLN
INTRAVENOUS | Status: DC | PRN
  Administered 2018-07-15: 200 mg via INTRAVENOUS

## 2018-07-15 MED ORDER — ALUM & MAG HYDROXIDE-SIMETH 200-200-20 MG/5ML PO SUSP: 30.0000 mL | ORAL | Status: DC | PRN

## 2018-07-15 MED ORDER — DIPHENHYDRAMINE HCL 50 MG/ML IJ SOLN
INTRAMUSCULAR | Status: DC | PRN
  Administered 2018-07-15: 12.5 mg via INTRAVENOUS

## 2018-07-15 MED ORDER — BUPIVACAINE HCL (PF) 0.25 % IJ SOLN
INTRAMUSCULAR | Status: AC
  Filled 2018-07-15: qty 30

## 2018-07-15 MED ORDER — OXYCODONE HCL 5 MG PO TABS
10.0000 mg | ORAL_TABLET | ORAL | Status: DC | PRN
  Administered 2018-07-15 – 2018-07-16 (×4): 10 mg via ORAL
  Filled 2018-07-15 (×2): qty 2

## 2018-07-15 MED ORDER — MENTHOL 3 MG MT LOZG
1.0000 | LOZENGE | OROMUCOSAL | Status: DC | PRN
  Filled 2018-07-15: qty 9

## 2018-07-15 MED ORDER — FENTANYL CITRATE (PF) 100 MCG/2ML IJ SOLN
INTRAMUSCULAR | Status: DC | PRN
  Administered 2018-07-15 (×4): 50 ug via INTRAVENOUS
  Administered 2018-07-15: 100 ug via INTRAVENOUS

## 2018-07-15 MED ORDER — ADULT MULTIVITAMIN W/MINERALS CH
1.0000 | ORAL_TABLET | Freq: Every day | ORAL | Status: DC
  Administered 2018-07-16: 1 via ORAL
  Filled 2018-07-15: qty 1

## 2018-07-15 MED ORDER — ACETAMINOPHEN 500 MG PO TABS
1000.0000 mg | ORAL_TABLET | Freq: Four times a day (QID) | ORAL | Status: DC
  Administered 2018-07-15 – 2018-07-16 (×3): 1000 mg via ORAL
  Filled 2018-07-15 (×4): qty 2

## 2018-07-15 MED ORDER — ASPIRIN EC 325 MG PO TBEC
325.0000 mg | DELAYED_RELEASE_TABLET | Freq: Two times a day (BID) | ORAL | Status: DC
  Administered 2018-07-15 – 2018-07-16 (×2): 325 mg via ORAL
  Filled 2018-07-15 (×2): qty 1

## 2018-07-15 MED ORDER — POLYETHYLENE GLYCOL 3350 17 G PO PACK: 17.0000 g | PACK | Freq: Every day | ORAL | Status: DC | PRN

## 2018-07-15 MED ORDER — LIDOCAINE HCL (CARDIAC) PF 100 MG/5ML IV SOSY
PREFILLED_SYRINGE | INTRAVENOUS | Status: DC | PRN
  Administered 2018-07-15: 100 mg via INTRAVENOUS

## 2018-07-15 MED ORDER — OXYCODONE HCL 5 MG PO TABS: 5.0000 mg | ORAL_TABLET | ORAL | 0 refills | Status: DC | PRN

## 2018-07-15 MED ORDER — GLYCOPYRROLATE 0.2 MG/ML IJ SOLN
INTRAMUSCULAR | Status: AC
  Filled 2018-07-15: qty 1

## 2018-07-15 MED ORDER — CEFAZOLIN SODIUM-DEXTROSE 2-4 GM/100ML-% IV SOLN
INTRAVENOUS | Status: AC
  Filled 2018-07-15: qty 100

## 2018-07-15 MED ORDER — PROPOFOL 10 MG/ML IV BOLUS
INTRAVENOUS | Status: DC | PRN
  Administered 2018-07-15: 200 mg via INTRAVENOUS

## 2018-07-15 MED ORDER — ARTIFICIAL TEARS OPHTHALMIC OINT
TOPICAL_OINTMENT | OPHTHALMIC | Status: AC
  Filled 2018-07-15: qty 3.5

## 2018-07-15 MED ORDER — NEOMYCIN-POLYMYXIN B GU 40-200000 IR SOLN
Status: DC | PRN
  Administered 2018-07-15: 4 mL

## 2018-07-15 MED ORDER — ROCURONIUM BROMIDE 100 MG/10ML IV SOLN
INTRAVENOUS | Status: DC | PRN
  Administered 2018-07-15: 10 mg via INTRAVENOUS
  Administered 2018-07-15: 40 mg via INTRAVENOUS

## 2018-07-15 MED ORDER — ONDANSETRON HCL 4 MG/2ML IJ SOLN
INTRAMUSCULAR | Status: AC
  Filled 2018-07-15: qty 2

## 2018-07-15 MED ORDER — METHOCARBAMOL 1000 MG/10ML IJ SOLN
500.0000 mg | Freq: Four times a day (QID) | INTRAVENOUS | Status: DC | PRN
  Filled 2018-07-15: qty 5

## 2018-07-15 MED ORDER — ACETAMINOPHEN 10 MG/ML IV SOLN
INTRAVENOUS | Status: AC
  Filled 2018-07-15: qty 100

## 2018-07-15 MED ORDER — HYDROMORPHONE HCL 1 MG/ML IJ SOLN: 0.5000 mg | INTRAMUSCULAR | Status: DC | PRN

## 2018-07-15 MED ORDER — ONDANSETRON HCL 4 MG PO TABS: 4.0000 mg | ORAL_TABLET | Freq: Four times a day (QID) | ORAL | Status: DC | PRN

## 2018-07-15 MED ORDER — ALBUTEROL SULFATE (2.5 MG/3ML) 0.083% IN NEBU: 3.0000 mL | INHALATION_SOLUTION | Freq: Four times a day (QID) | RESPIRATORY_TRACT | Status: DC | PRN

## 2018-07-15 MED ORDER — SUCCINYLCHOLINE CHLORIDE 20 MG/ML IJ SOLN
INTRAMUSCULAR | Status: AC
  Filled 2018-07-15: qty 1

## 2018-07-15 MED ORDER — MICONAZOLE NITRATE 2 % EX CREA
1.0000 "application " | TOPICAL_CREAM | Freq: Two times a day (BID) | CUTANEOUS | Status: DC
  Filled 2018-07-15: qty 14

## 2018-07-15 MED ORDER — ONDANSETRON HCL 4 MG/2ML IJ SOLN: 4.0000 mg | Freq: Four times a day (QID) | INTRAMUSCULAR | Status: DC | PRN

## 2018-07-15 MED ORDER — ACETAMINOPHEN 10 MG/ML IV SOLN
INTRAVENOUS | Status: DC | PRN
  Administered 2018-07-15: 1000 mg via INTRAVENOUS

## 2018-07-15 MED ORDER — DEXAMETHASONE SODIUM PHOSPHATE 10 MG/ML IJ SOLN
INTRAMUSCULAR | Status: DC | PRN
  Administered 2018-07-15: 10 mg via INTRAVENOUS

## 2018-07-15 MED ORDER — ACETAMINOPHEN 325 MG PO TABS: 325.0000 mg | ORAL_TABLET | Freq: Four times a day (QID) | ORAL | Status: DC | PRN

## 2018-07-15 SURGICAL SUPPLY — 63 items
BANDAGE ACE 6X5 VEL STRL LF (GAUZE/BANDAGES/DRESSINGS) ×3 IMPLANT
BANDAGE ELASTIC 4 LF NS (GAUZE/BANDAGES/DRESSINGS) ×6 IMPLANT
BIT DRILL CANN 2.7X625 NONSTRL (BIT) ×3 IMPLANT
BLADE SURG 15 STRL LF DISP TIS (BLADE) ×1 IMPLANT
BLADE SURG 15 STRL SS (BLADE) ×2
BNDG ESMARK 6X12 TAN STRL LF (GAUZE/BANDAGES/DRESSINGS) ×3 IMPLANT
CLOSURE WOUND 1/2 X4 (GAUZE/BANDAGES/DRESSINGS) ×2
COVER WAND RF STERILE (DRAPES) ×3 IMPLANT
CUFF TOURN 24 STER (MISCELLANEOUS) IMPLANT
CUFF TOURN 30 STER DUAL PORT (MISCELLANEOUS) ×3 IMPLANT
DRAPE FLUOR MINI C-ARM 54X84 (DRAPES) ×3 IMPLANT
DRAPE INCISE IOBAN 66X45 STRL (DRAPES) ×3 IMPLANT
DRAPE U-SHAPE 47X51 STRL (DRAPES) ×3 IMPLANT
DURAPREP 26ML APPLICATOR (WOUND CARE) ×9 IMPLANT
ELECT REM PT RETURN 9FT ADLT (ELECTROSURGICAL) ×3
ELECTRODE REM PT RTRN 9FT ADLT (ELECTROSURGICAL) ×1 IMPLANT
GAUZE PETRO XEROFOAM 1X8 (MISCELLANEOUS) ×3 IMPLANT
GAUZE SPONGE 4X4 12PLY STRL (GAUZE/BANDAGES/DRESSINGS) ×3 IMPLANT
GLOVE BIOGEL PI IND STRL 7.5 (GLOVE) ×4 IMPLANT
GLOVE BIOGEL PI IND STRL 9 (GLOVE) ×1 IMPLANT
GLOVE BIOGEL PI INDICATOR 7.5 (GLOVE) ×8
GLOVE BIOGEL PI INDICATOR 9 (GLOVE) ×2
GLOVE SURG 9.0 ORTHO LTXF (GLOVE) ×6 IMPLANT
GOWN STRL REUS TWL 2XL XL LVL4 (GOWN DISPOSABLE) ×3 IMPLANT
GOWN STRL REUS W/ TWL LRG LVL3 (GOWN DISPOSABLE) ×1 IMPLANT
GOWN STRL REUS W/TWL LRG LVL3 (GOWN DISPOSABLE) ×2
GUIDEWIRE THREADED 1.1X150MM (WIRE) ×6 IMPLANT
K-wire 1.25mm 292.62 ×6 IMPLANT
KIT TURNOVER KIT A (KITS) ×3 IMPLANT
LABEL OR SOLS (LABEL) ×3 IMPLANT
NS IRRIG 1000ML POUR BTL (IV SOLUTION) ×3 IMPLANT
PACK EXTREMITY ARMC (MISCELLANEOUS) ×3 IMPLANT
PAD ABD DERMACEA PRESS 5X9 (GAUZE/BANDAGES/DRESSINGS) ×6 IMPLANT
PAD CAST CTTN 4X4 STRL (SOFTGOODS) ×3 IMPLANT
PADDING CAST 4IN STRL (MISCELLANEOUS) ×4
PADDING CAST BLEND 4X4 STRL (MISCELLANEOUS) ×2 IMPLANT
PADDING CAST COTTON 4X4 STRL (SOFTGOODS) ×6
PLATE LCP 3.5 1/3 TUB 7HX81 (Plate) ×3 IMPLANT
SCREW CANC FT ST SFS 4X16 (Screw) ×3 IMPLANT
SCREW CANC FT/18 4.0 (Screw) ×3 IMPLANT
SCREW CANN L THRD/30 4.0 (Screw) ×3 IMPLANT
SCREW CANN L THRD/40 4.0 (Screw) ×3 IMPLANT
SCREW CORTEX 3.5 12MM (Screw) ×4 IMPLANT
SCREW CORTEX 3.5 14MM (Screw) ×4 IMPLANT
SCREW CORTEX 3.5 22MM (Screw) ×2 IMPLANT
SCREW CORTEX 3.5 28MM (Screw) ×2 IMPLANT
SCREW LOCK CORT ST 3.5X12 (Screw) ×2 IMPLANT
SCREW LOCK CORT ST 3.5X14 (Screw) ×2 IMPLANT
SCREW LOCK CORT ST 3.5X22 (Screw) ×1 IMPLANT
SCREW LOCK CORT ST 3.5X28 (Screw) ×1 IMPLANT
SCREWDRIVER SHAFT CANN 2.5MM 3 (MISCELLANEOUS) ×6 IMPLANT
SPLINT CAST 1 STEP 4X30 (MISCELLANEOUS) ×6 IMPLANT
SPONGE LAP 18X18 RF (DISPOSABLE) ×3 IMPLANT
STAPLER SKIN PROX 35W (STAPLE) ×3 IMPLANT
STOCKINETTE STRL 6IN 960660 (GAUZE/BANDAGES/DRESSINGS) ×3 IMPLANT
STRIP CLOSURE SKIN 1/2X4 (GAUZE/BANDAGES/DRESSINGS) ×4 IMPLANT
SUT VIC AB 0 CT1 36 (SUTURE) ×3 IMPLANT
SUT VIC AB 2-0 SH 27 (SUTURE) ×2
SUT VIC AB 2-0 SH 27XBRD (SUTURE) ×1 IMPLANT
SUT VIC AB 3-0 SH 27 (SUTURE) ×2
SUT VIC AB 3-0 SH 27X BRD (SUTURE) ×1 IMPLANT
SYR 30ML LL (SYRINGE) ×3 IMPLANT
TAPE MICROPORE 2IN (TAPE) ×3 IMPLANT

## 2018-07-15 NOTE — Op Note (Signed)
07/15/2018  4:36 PM  PATIENT:  Darlene Escobar    PRE-OPERATIVE DIAGNOSIS:  CLOSED TRIMALLEOLAR FRACTURE OF RIGHT ANKLE  POST-OPERATIVE DIAGNOSIS:  Same  PROCEDURE:  OPEN REDUCTION INTERNAL FIXATION (ORIF) ANKLE FRACTURE  SURGEON:  Thornton Park, MD  ANESTHESIA:   General  PREOPERATIVE INDICATIONS:  Darlene Escobar is a  42 y.o. female with a diagnosis of CLOSED TRIMALLEOLAR FRACTURE OF RIGHT ANKLE who was recommended for open reduction internal fixation based on the displacement and instability of her ankle fracture.  She had posterior subluxation of her talus..    I discussed the risks and benefits of surgery. The risks include but are not limited to infection, bleeding requiring blood transfusion, nerve or blood vessel injury, joint stiffness or loss of motion, persistent pain, weakness or instability, malunion, nonunion and hardware failure and the need for further surgery. Medical risks include but are not limited to DVT and pulmonary embolism, myocardial infarction, stroke, pneumonia, respiratory failure and death. Patient understood these risks and wished to proceed.   OPERATIVE IMPLANTS: Synthes 7 hole 1/3 tubular plate and lateral fixation and two 4.0 mm cannulated screws for medial fixation  OPERATIVE FINDINGS: Trimalleolar ankle fracture with posterior subluxation of the ankle joint  OPERATIVE PROCEDURE:   Patient was met in the preoperative area.  A preop H&P was performed.  The right leg was signed my initials and the word yes according the hospital's correct site of surgery protocol. The patient was brought to the operating room where she underwent general anesthesia. The patient was placed supine on the operative table. A bump was placed under the right hip. A tourniquet was applied to the right thigh.  The lower extremity was prepped and draped in a sterile fashion. A timeout was performed to verify the patient's name, date of birth, medical record number, correct site  of surgery and correct procedure to be performed. It was also used to verify the patient received antibiotics, and that all appropriate instruments, implants and radiographic studies were available in the room. Once all in attendance were in agreement, the case began.  The right lower extremity was exsanguinated with an Esmarch. The tourniquet was inflated to 275 mmHg. This was applied for a total of 83 minutes. A lateral incision was made over the fibula. The subcutaneous tissues were dissected with the Metzenbaum scissor and pickup. Care was taken to avoid injury to the superficial peroneal nerve. The lateral malleolus fracture was identified and irrigated and fracture hematoma was removed. Soft tissue was removed from the fracture site using a periosteal elevator. A fracture reduction clamp was then used to reduce the fracture to an anatomic position.     The lateral malleolus was then drilled in an AP direction, perpendicular to the fracture site to allow for placement of the lag screw.   A single lag screw, 28 mm in length, was advanced across the fracture site by hand. This compressed the fracture.   A 7 hole, 1/3 tubular plate was then contoured and placed along the lateral fibula. Bicortical screws were placed proximal to the fracture and fully threaded cancellus screws were placed distal the fracture. The fracture reduction and hardware placement were confirmed on AP and lateral imaging.  Once the lateral malleolus was plated, the attention was turned to the medial ankle. A small vertical incision was made over the tip of the medial malleolus.  Soft tissue was dissected with some with the Metzenbaum scissor and pickup. The fracture of the medial malleolus was identified. This  was reduced with a dental pick. 2 threaded K wires for the 4.0 cannulated screws were then advanced through the tip of the medial malleolus across the fracture site and into the distal tibia. The position of the K wires was  evaluated on AP and lateral FluoroScan images. The length of the wires were measured with a depth gauge and were determined to be 40 and  50m in length. The wires were then overdrilled with a cannulated drill for the 4.0 cannulated screws. The two long threaded 4.0 cannulated screws were then advanced into position by hand, compressing the medial malleolus fracture.    The posterior malleolus was then examined under fluoroscopy. It was felt to be less than 20% of the articular surface and was in a near anatomic position. The decision was made made not to place an AP screw given its stability and small size.  A stress test of the right ankle was then performed under fluoroscopy.  This test did not reveal any syndesmotic injury or opening of the medial clear space.  The medial and lateral incisions were then copiously irrigated. The subcutaneous tissue was closed with 2-0 Vicryl and the skin approximated staples. A dry sterile dressing was applied along with an AO splint. The patient's ankle was positioned in neutral. The pateint was then awoken from anesthesia, transferred to hospital bed and brought to the PACU in stable condition. I was scrubbed and present the entire case and all sharp and instrument counts were correct at conclusion the case. I spoke to the patient's husband in the post-op consultation room to let him know the case was performed without complication and the patient was stable in recovery room.    KTimoteo Gaul MD

## 2018-07-15 NOTE — H&P (Signed)
PREOPERATIVE H&P  Chief Complaint: CLOSED TRIMALLEOLAR FRACTURE OF RIGHT ANKLE  HPI: Darlene Escobar is a 42 y.o. female who presents for preoperative history and physical with a diagnosis of CLOSED TRIMALLEOLAR FRACTURE OF RIGHT ANKLE.  Given the displacement and instability of this fracture patient was recommended for an open reduction internal fixation.   Past Medical History:  Diagnosis Date  . Anemia    in her 20's  . Anxiety   . GERD (gastroesophageal reflux disease)    with pregnancy  . Mild asthma    well controlled   Past Surgical History:  Procedure Laterality Date  . TONSILLECTOMY     age 42    Social History   Socioeconomic History  . Marital status: Married    Spouse name: Not on file  . Number of children: Not on file  . Years of education: Not on file  . Highest education level: Not on file  Occupational History  . Not on file  Social Needs  . Financial resource strain: Not on file  . Food insecurity:    Worry: Not on file    Inability: Not on file  . Transportation needs:    Medical: Not on file    Non-medical: Not on file  Tobacco Use  . Smoking status: Former Smoker    Packs/day: 1.00    Years: 10.00    Pack years: 10.00    Types: Cigarettes    Last attempt to quit: 07/13/2005    Years since quitting: 13.0  . Smokeless tobacco: Never Used  Substance and Sexual Activity  . Alcohol use: Yes    Comment: occ wine  . Drug use: No  . Sexual activity: Yes    Birth control/protection: Condom  Lifestyle  . Physical activity:    Days per week: Not on file    Minutes per session: Not on file  . Stress: Not on file  Relationships  . Social connections:    Talks on phone: Not on file    Gets together: Not on file    Attends religious service: Not on file    Active member of club or organization: Not on file    Attends meetings of clubs or organizations: Not on file    Relationship status: Not on file  Other Topics Concern  . Not on file   Social History Narrative  . Not on file   Family History  Problem Relation Age of Onset  . Diabetes Mother   . Kidney cancer Mother   . Heart disease Father   . Cancer Neg Hx    No Known Allergies Prior to Admission medications   Medication Sig Start Date End Date Taking? Authorizing Provider  albuterol (PROVENTIL HFA;VENTOLIN HFA) 108 (90 Base) MCG/ACT inhaler Inhale 2 puffs into the lungs every 6 (six) hours as needed for wheezing or shortness of breath.    Yes [provider]  B Complex Vitamins (B COMPLEX 1 PO) Take 1 capsule by mouth daily.    Yes [provider]  HYDROcodone-acetaminophen (NORCO/VICODIN) 5-325 MG tablet Take 1 tablet by mouth every 6 (six) hours as needed for moderate pain.   Yes [provider]  miconazole (MICATIN) 2 % cream Apply 1 application topically 2 (two) times daily. Patient taking differently: Apply 1 application topically 2 (two) times daily.  06/24/18  Yes Shambley, Melody N, CNM  Multiple Vitamin (MULTIVITAMIN WITH MINERALS) TABS tablet Take 1 tablet by mouth daily.   Yes [provider]  sertraline (ZOLOFT) 100 MG tablet Take 1 tablet (100 mg total) by mouth daily. Patient taking differently: Take 100 mg by mouth every morning.  06/24/18  Yes Shambley, Melody N, CNM  Vitamin D, Ergocalciferol, (DRISDOL) 50000 units CAPS capsule Take 1 capsule (50,000 Units total) by mouth 2 (two) times a week. 06/30/18  Yes Shambley, Melody N, CNM  cyclobenzaprine (FLEXERIL) 10 MG tablet Take 1 tablet (10 mg total) by mouth at bedtime. Patient not taking: Reported on 06/24/2018 04/06/18   Payton Mccallum, MD     Positive ROS: All other systems have been reviewed and were otherwise negative with the exception of those mentioned in the HPI and as above.  Physical Exam: General: Alert, no acute distress Cardiovascular: Regular rate and rhythm, no murmurs rubs or gallops.  No pedal edema Respiratory: Clear to auscultation  bilaterally, no wheezes rales or rhonchi. No cyanosis, no use of accessory musculature GI: No organomegaly, abdomen is soft and non-tender nondistended with positive bowel sounds. Skin: Skin intact, no lesions within the operative field. Neurologic: Sensation intact distally Psychiatric: Patient is competent for consent with normal mood and affect Lymphatic: No cervical lymphadenopathy  MUSCULOSKELETAL: Right lower extremity: Patient skin is intact.  Patient has mild to moderate swelling.  Compartments are soft and compressible.  She has palpable pedal pulses, intact sensation light touch and intact motor function distally.  Assessment: CLOSED TRIMALLEOLAR FRACTURE OF RIGHT ANKLE  Plan: Plan for Procedure(s): OPEN REDUCTION INTERNAL FIXATION (ORIF) ANKLE FRACTURE  I reviewed the details of open reduction and internal fixation surgery for her right ankle.  We discussed the postoperative course.  I so discussed the risks and benefits of surgery. The patient understands the risks include but are not limited to infection, bleeding requiring blood transfusion, nerve or blood vessel injury, joint stiffness or loss of motion, persistent pain, weakness or instability, malunion, nonunion and hardware failure and the need for further surgery. Medical risks include but are not limited to DVT and pulmonary embolism, myocardial infarction, stroke, pneumonia, respiratory failure and death. Patient understood these risks and wished to proceed.    Juanell Fairly, MD   07/15/2018 2:06 PM

## 2018-07-15 NOTE — Anesthesia Procedure Notes (Signed)
Procedure Name: Intubation Date/Time: 07/15/2018 2:21 PM Performed by: Rudean Hitt, CRNA Pre-anesthesia Checklist: Patient identified, Patient being monitored, Timeout performed, Emergency Drugs available and Suction available Patient Re-evaluated:Patient Re-evaluated prior to induction Oxygen Delivery Method: Circle system utilized Preoxygenation: Pre-oxygenation with 100% oxygen Induction Type: IV induction Ventilation: Mask ventilation without difficulty Laryngoscope Size: Mac and 3 (Grade 1 view with cricoid pressure) Grade View: Grade I Tube type: Oral Tube size: 7.0 mm Number of attempts: 1 Airway Equipment and Method: Stylet Placement Confirmation: ETT inserted through vocal cords under direct vision,  positive ETCO2 and breath sounds checked- equal and bilateral Secured at: 21 cm Tube secured with: Tape Dental Injury: Teeth and Oropharynx as per pre-operative assessment

## 2018-07-15 NOTE — Anesthesia Preprocedure Evaluation (Signed)
Anesthesia Evaluation  Patient identified by MRN, date of birth, ID band Patient awake    Reviewed: Allergy & Precautions, NPO status , Patient's Chart, lab work & pertinent test results  Airway Mallampati: II       Dental no notable dental hx.    Pulmonary asthma , former smoker,    Pulmonary exam normal        Cardiovascular negative cardio ROS Normal cardiovascular exam     Neuro/Psych Anxiety negative neurological ROS  negative psych ROS   GI/Hepatic Neg liver ROS, GERD  ,  Endo/Other  negative endocrine ROS  Renal/GU negative Renal ROS  negative genitourinary   Musculoskeletal negative musculoskeletal ROS (+)   Abdominal Normal abdominal exam  (+)   Peds negative pediatric ROS (+)  Hematology negative hematology ROS (+) anemia ,   Anesthesia Other Findings   Reproductive/Obstetrics                             Anesthesia Physical  Anesthesia Plan  ASA: II  Anesthesia Plan:    Post-op Pain Management:    Induction:   PONV Risk Score and Plan:   Airway Management Planned:   Additional Equipment:   Intra-op Plan:   Post-operative Plan:   Informed Consent: I have reviewed the patients History and Physical, chart, labs and discussed the procedure including the risks, benefits and alternatives for the proposed anesthesia with the patient or authorized representative who has indicated his/her understanding and acceptance.   Dental advisory given  Plan Discussed with: CRNA and Surgeon  Anesthesia Plan Comments:         Anesthesia Quick Evaluation

## 2018-07-15 NOTE — Progress Notes (Signed)
Subjective:  POST OP CHECK: Patient being admitted for postoperative pain control.  Patient reports right ankle pain as marked.  Pain is starting to improve after narcotics in the PACU.  Objective:   VITALS:   Vitals:   07/15/18 1724 07/15/18 1730 07/15/18 1737 07/15/18 1759  BP: 139/84  (!) 139/93 (!) 142/91  Pulse: 80 87 85 84  Resp: (!) 23 15 15 12   Temp:    97.8 F (36.6 C)  TempSrc:      SpO2: 97% 98% 92% 94%    PHYSICAL EXAM: Right lower extremity: Right lower extremity elevated on pillows.  Splint is clean dry and intact.  Toes are well-perfused.  She has intact sensation light touch in all toes and can flex and extend her toes. Neurovascular intact Sensation intact distally  LABS  Results for orders placed or performed during the hospital encounter of 07/15/18 (from the past 24 hour(s))  Pregnancy, urine POC     Status: None   Collection Time: 07/15/18 12:53 PM  Result Value Ref Range   Preg Test, Ur NEGATIVE NEGATIVE  APTT     Status: None   Collection Time: 07/15/18 12:54 PM  Result Value Ref Range   aPTT 32 24 - 36 seconds  Basic metabolic panel     Status: None   Collection Time: 07/15/18 12:54 PM  Result Value Ref Range   Sodium 138 135 - 145 mmol/L   Potassium 4.0 3.5 - 5.1 mmol/L   Chloride 105 98 - 111 mmol/L   CO2 23 22 - 32 mmol/L   Glucose, Bld 90 70 - 99 mg/dL   BUN 10 6 - 20 mg/dL   Creatinine, Ser 5.400.70 0.44 - 1.00 mg/dL   Calcium 9.1 8.9 - 98.110.3 mg/dL   GFR calc non Af Amer >60 >60 mL/min   GFR calc Af Amer >60 >60 mL/min   Anion gap 10 5 - 15  CBC WITH DIFFERENTIAL     Status: None   Collection Time: 07/15/18 12:54 PM  Result Value Ref Range   WBC 7.3 4.0 - 10.5 K/uL   RBC 3.95 3.87 - 5.11 MIL/uL   Hemoglobin 12.6 12.0 - 15.0 g/dL   HCT 19.137.2 47.836.0 - 29.546.0 %   MCV 94.2 80.0 - 100.0 fL   MCH 31.9 26.0 - 34.0 pg   MCHC 33.9 30.0 - 36.0 g/dL   RDW 62.112.3 30.811.5 - 65.715.5 %   Platelets 277 150 - 400 K/uL   nRBC 0.0 0.0 - 0.2 %   Neutrophils  Relative % 55 %   Neutro Abs 4.0 1.7 - 7.7 K/uL   Lymphocytes Relative 35 %   Lymphs Abs 2.5 0.7 - 4.0 K/uL   Monocytes Relative 7 %   Monocytes Absolute 0.5 0.1 - 1.0 K/uL   Eosinophils Relative 2 %   Eosinophils Absolute 0.2 0.0 - 0.5 K/uL   Basophils Relative 0 %   Basophils Absolute 0.0 0.0 - 0.1 K/uL   Immature Granulocytes 1 %   Abs Immature Granulocytes 0.04 0.00 - 0.07 K/uL  Protime-INR     Status: None   Collection Time: 07/15/18 12:54 PM  Result Value Ref Range   Prothrombin Time 12.5 11.4 - 15.2 seconds   INR 0.94     Dg Ankle Right Port  Result Date: 07/15/2018 CLINICAL DATA:  Postop right ankle. EXAM: PORTABLE RIGHT ANKLE - 2 VIEW COMPARISON:  07/06/2018. FINDINGS: Patient is in a cast. Soft tissue swelling. Surgical staples noted. Plate and  screw fixation of the fibula and medial malleolus noted. Hardware intact. Anatomic alignment. IMPRESSION: Patient is in a cast. Soft tissue swelling. Surgical staples noted. ORIF distal fibula and medial malleolus. Hardware intact. Electronically Signed   By: Maisie Fus  Register   On: 07/15/2018 17:25    Assessment/Plan: Day of Surgery   Active Problems:   Bimalleolar ankle fracture, right, closed, initial encounter  Patient be admitted for postoperative pain control.  Continue strict elevation of the right lower extremity.  Ice pack may be applied to the right ankle.  Continue neurovascular checks.  Patient will have physical occupational therapy in the morning.  Weekly discharge tomorrow if pain is controlled.  She will take enteric-coated aspirin 325 mg p.o. twice daily for DVT prophylaxis.  Patient will receive 24 hours of postop antibiotics.    Juanell Fairly , MD 07/15/2018, 6:17 PM

## 2018-07-15 NOTE — Transfer of Care (Signed)
Immediate Anesthesia Transfer of Care Note  Patient: Darlene Escobar  Procedure(s) Performed: OPEN REDUCTION INTERNAL FIXATION (ORIF) ANKLE FRACTURE (Right Ankle)  Patient Location: PACU  Anesthesia Type:General  Level of Consciousness: awake  Airway & Oxygen Therapy: Patient Spontanous Breathing and Patient connected to face mask oxygen  Post-op Assessment: Report given to RN and Post -op Vital signs reviewed and stable  Post vital signs: Reviewed  Last Vitals:  Vitals Value Taken Time  BP 151/91 07/15/2018  4:38 PM  Temp    Pulse 112 07/15/2018  4:38 PM  Resp 19 07/15/2018  4:38 PM  SpO2 100 % 07/15/2018  4:38 PM  Vitals shown include unvalidated device data.  Last Pain:  Vitals:   07/15/18 1317  TempSrc: Temporal  PainSc: 5       Patients Stated Pain Goal: 3 (07/15/18 1317)  Complications: No apparent anesthesia complications

## 2018-07-15 NOTE — Anesthesia Post-op Follow-up Note (Signed)
Anesthesia QCDR form completed.        

## 2018-07-16 DIAGNOSIS — S82851A Displaced trimalleolar fracture of right lower leg, initial encounter for closed fracture: Secondary | ICD-10-CM | POA: Diagnosis not present

## 2018-07-16 LAB — CBC
HEMATOCRIT: 34.2 % — AB (ref 36.0–46.0)
Hemoglobin: 11.4 g/dL — ABNORMAL LOW (ref 12.0–15.0)
MCH: 32 pg (ref 26.0–34.0)
MCHC: 33.3 g/dL (ref 30.0–36.0)
MCV: 96.1 fL (ref 80.0–100.0)
Platelets: 298 10*3/uL (ref 150–400)
RBC: 3.56 MIL/uL — ABNORMAL LOW (ref 3.87–5.11)
RDW: 12.4 % (ref 11.5–15.5)
WBC: 12.1 10*3/uL — AB (ref 4.0–10.5)
nRBC: 0 % (ref 0.0–0.2)

## 2018-07-16 LAB — BASIC METABOLIC PANEL
Anion gap: 7 (ref 5–15)
BUN: 13 mg/dL (ref 6–20)
CALCIUM: 8.6 mg/dL — AB (ref 8.9–10.3)
CHLORIDE: 107 mmol/L (ref 98–111)
CO2: 24 mmol/L (ref 22–32)
Creatinine, Ser: 0.74 mg/dL (ref 0.44–1.00)
GFR calc non Af Amer: >60 mL/min (ref >60)
Glucose, Bld: 152 mg/dL — ABNORMAL HIGH (ref 70–99)
Potassium: 4.4 mmol/L (ref 3.5–5.1)
SODIUM: 138 mmol/L (ref 135–145)

## 2018-07-16 NOTE — Evaluation (Signed)
Occupational Therapy Evaluation Patient Details Name: Darlene Escobar MRN: [id number] DOB: 06-Sep-1975 Today's Date: 07/16/2018    History of Present Illness Darlene Escobar is a 42yo female who [facility] for elective ORIF of a Rt bimalleolar fracture. Pt sustained bilat ankle fractures last week AMB down her porch steps. LLE is WBAT in a cam rocker, but ortho recommended ORIF on Right.    Clinical Impression   Pt seen for OT evaluation this date, POD#1 from above surgery. Prior to recent fall leading to this hospital admission, pt was independent.  Pt lives with her spouse and 2 children, ages 1 and 193.  Currently pt demonstrates impairments in R>L foot pain, slightly lightheaded BP in sitting 114/75), and impaired balance requiring Min A assist for LB ADL (which spouse can provide) and supervision for safety with mobility while maintaining WBing precautions as noted below. Pt/spouse instructed in home/routines modifications, DME/AE for ADL and IADL, and falls prevention strategies to maximize safety and independence while recovering. All education/training provided. No additional skilled OT services indicated at this time. Pt with adequate family support at home and set up. Will d/c in house. Please re-consult if additional needs arise.    Follow Up Recommendations  No OT follow up    Equipment Recommendations  3 in 1 bedside commode(or toilet riser - whichever is most appropriate for home set up)    Recommendations for Other Services       Precautions / Restrictions Precautions Precautions: Fall Restrictions Weight Bearing Restrictions: Yes RLE Weight Bearing: Non weight bearing LLE Weight Bearing: Weight bearing as tolerated(in cam rocker boot)      Mobility Bed Mobility             General bed mobility comments: deferred, up in recliner  Transfers Overall transfer level: Needs assistance Equipment used: Rolling walker (2 wheeled) Transfers: Sit to/from  Stand Sit to Stand: Supervision            Balance Overall balance assessment: Needs assistance Sitting-balance support: Single extremity supported Sitting balance-Leahy Scale: Good     Standing balance support: Bilateral upper extremity supported;During functional activity Standing balance-Leahy Scale: Fair                             ADL either performed or assessed with clinical judgement   ADL Overall ADL's : Needs assistance/impaired     Grooming: Standing;Supervision/safety Grooming Details (indicate cue type and reason): UE support on counter Upper Body Bathing: Sitting;Independent   Lower Body Bathing: Sit to/from stand;Minimal assistance;With caregiver independent assisting   Upper Body Dressing : Sitting;Independent   Lower Body Dressing: Sit to/from stand;Minimal assistance;With caregiver independent assisting   Toilet Transfer: BSC;Regular Toilet;RW;Ambulation;Supervision/safety   Toileting- Clothing Manipulation and Hygiene: Sit to/from stand;Supervision/safety;Min guard       Functional mobility during ADLs: Supervision/safety;Rolling walker       Vision Baseline Vision/History: Wears glasses Wears Glasses: At all times(or contacts) Patient Visual Report: No change from baseline       Perception     Praxis      Pertinent Vitals/Pain Pain Assessment: 0-10 Pain Score: 5  Pain Location: Rt ankle  Pain Descriptors / Indicators: Aching Pain Intervention(s): Limited activity within patient's tolerance;Monitored during session;Premedicated before session;Repositioned     Hand Dominance     Extremity/Trunk Assessment Upper Extremity Assessment Upper Extremity Assessment: Overall WFL for tasks assessed   Lower Extremity Assessment Lower Extremity Assessment: RLE deficits/detail;LLE deficits/detail RLE  Deficits / Details: NWBing, R foot pain, intact sensation RLE: Unable to fully assess due to pain;Unable to fully assess due to  immobilization LLE Deficits / Details: WBAT, cam boot required    Cervical / Trunk Assessment Cervical / Trunk Assessment: Normal   Communication Communication Communication: No difficulties   Cognition Arousal/Alertness: Awake/alert Behavior During Therapy: WFL for tasks assessed/performed Overall Cognitive Status: Within Functional Limits for tasks assessed                                     General Comments       Exercises Other Exercises Other Exercises: pt/spouse instructed in AE/DME to maximize safety/indep with tub t/f, toilet t/f, bathing, and dressing Other Exercises: pt/spouse instructed in RW use in kitchen/bathroom environments  Other Exercises: pt/spouse instructed in home/routine modifications to maximize safety/independence with childcare tasks, ADL, and IADL   Shoulder Instructions      Home Living Family/patient expects to be discharged to:: Private residence Living Arrangements: Spouse/significant other Available Help at Discharge: Family(husband and 2 children (1yo, 3yo)) Type of Home: House Home Access: Ramped entrance;Stairs to enter     Home Layout: One level     Bathroom Shower/Tub: Tub/shower unit;Curtain   Firefighter: Standard     Home Equipment: Environmental consultant - 4 wheels;Wheelchair - manual;Tub bench   Additional Comments: church came out and built a ramp this week. Husband got a WC to get in/out of home. Tub transfer bench already ordered.      Prior Functioning/Environment Level of Independence: Independent        Comments: home-maker, mother of 2         OT Problem List:        OT Treatment/Interventions:      OT Goals(Current goals can be found in the care plan section) Acute Rehab OT Goals Patient Stated Goal: improve AMB tolerance with RW  OT Goal Formulation: All assessment and education complete, DC therapy  OT Frequency:     Barriers to D/C:            Co-evaluation              AM-PAC PT "6  Clicks" Daily Activity     Outcome Measure Help from another person eating meals?: None Help from another person taking care of personal grooming?: None Help from another person toileting, which includes using toliet, bedpan, or urinal?: None Help from another person bathing (including washing, rinsing, drying)?: A Little Help from another person to put on and taking off regular upper body clothing?: None Help from another person to put on and taking off regular lower body clothing?: A Little 6 Click Score: 22   End of Session    Activity Tolerance: Patient tolerated treatment well Patient left: in chair;with call bell/phone within reach;with chair alarm set;with family/visitor present  OT Visit Diagnosis: Other abnormalities of gait and mobility (R26.89);Pain Pain - Right/Left: Right Pain - part of body: Ankle and joints of foot                Time: 1027-1059 OT Time Calculation (min): 32 min Charges:  OT General Charges $OT Visit: 1 Visit OT Evaluation $OT Eval Low Complexity: 1 Low OT Treatments $Self Care/Home Management : 23-37 mins  Richrd Prime, MPH, MS, OTR/L ascom [phone] 07/16/18, 11:13 AM

## 2018-07-16 NOTE — Discharge Summary (Signed)
Physician Discharge Summary  Patient ID: Darlene Escobar MRN: [id number] DOB/AGE: [birth year] 42 y.o.  Admit date: 07/15/2018 Discharge date: 07/16/2018  Admission Diagnoses:  CLOSED TRIMALLEOLAR FRACTURE OF RIGHT ANKLE <principal problem not specified>  Discharge Diagnoses:  CLOSED TRIMALLEOLAR FRACTURE OF RIGHT ANKLE Active Problems:   Bimalleolar ankle fracture, right, closed, initial encounter   Past Medical History:  Diagnosis Date  . Anemia    in her 20's  . Anxiety   . GERD (gastroesophageal reflux disease)    with pregnancy  . Mild asthma    well controlled    Surgeries: Procedure(s): OPEN REDUCTION INTERNAL FIXATION (ORIF) ANKLE FRACTURE on 07/15/2018   Consultants (if any):   Discharged Condition: Improved  Hospital Course: Darlene Escobar is an 42 y.o. female who was admitted 07/15/2018 with a diagnosis of  CLOSED TRIMALLEOLAR FRACTURE OF RIGHT ANKLE <principal problem not specified> and went to the operating room on 07/15/2018 and underwent the above named procedures.    She was given perioperative antibiotics:  Anti-infectives (From admission, onward)   Start     Dose/Rate Route Frequency Ordered Stop   07/15/18 2030  ceFAZolin (ANCEF) IVPB 2g/100 mL premix     2 g 200 mL/hr over 30 Minutes Intravenous Every 6 hours 07/15/18 1814 07/16/18 0356   07/15/18 1238  ceFAZolin (ANCEF) 2-4 GM/100ML-% IVPB    Note to Pharmacy:  [name]  : cabinet override      07/15/18 1238 07/15/18 1432   07/15/18 0600  ceFAZolin (ANCEF) IVPB 2g/100 mL premix     2 g 200 mL/hr over 30 Minutes Intravenous On call to O.R. 07/14/18 2131 07/15/18 1432    .  She was given sequential compression devices, early ambulation, and aspirin for DVT prophylaxis.  Patient was admitted for postoperative pain control overnight.  Her pain improved by postop day #1.  No other acute issues.  Patient had physical and Occupational Therapy evaluations.  She received 24 hours of  postop antibiotics.  She is being recommended for home health PT, a rolling walker and a bedside commode, all of which has been ordered.  She benefited maximally from the hospital stay and there were no complications.    Recent vital signs:  Vitals:   07/16/18 0950 07/16/18 1102  BP:  114/75  Pulse: (!) 108 91  Resp:    Temp:    SpO2: 96%     Recent laboratory studies:  Lab Results  Component Value Date   HGB 11.4 (L) 07/16/2018   HGB 12.6 07/15/2018   HGB 12.2 12/30/2016   Lab Results  Component Value Date   WBC 12.1 (H) 07/16/2018   PLT 298 07/16/2018   Lab Results  Component Value Date   INR 0.94 07/15/2018   Lab Results  Component Value Date   NA 138 07/16/2018   K 4.4 07/16/2018   CL 107 07/16/2018   CO2 24 07/16/2018   BUN 13 07/16/2018   CREATININE 0.74 07/16/2018   GLUCOSE 152 (H) 07/16/2018    Discharge Medications:   Allergies as of 07/16/2018   No Known Allergies     Medication List    STOP taking these medications   HYDROcodone-acetaminophen 5-325 MG tablet Commonly known as:  NORCO/VICODIN   sertraline 100 MG tablet Commonly known as:  ZOLOFT     TAKE these medications   albuterol 108 (90 Base) MCG/ACT inhaler Commonly known as:  PROVENTIL HFA;VENTOLIN HFA Inhale 2 puffs into the lungs every 6 (six) hours as needed for  wheezing or shortness of breath.   aspirin EC 325 MG tablet Take 1 tablet (325 mg total) by mouth 2 (two) times daily.   B COMPLEX 1 PO Take 1 capsule by mouth daily.   cyclobenzaprine 10 MG tablet Commonly known as:  FLEXERIL Take 1 tablet (10 mg total) by mouth at bedtime.   miconazole 2 % cream Commonly known as:  MICOTIN Apply 1 application topically 2 (two) times daily.   multivitamin with minerals Tabs tablet Take 1 tablet by mouth daily.   ondansetron 4 MG tablet Commonly known as:  ZOFRAN Take 1 tablet (4 mg total) by mouth every 8 (eight) hours as needed for nausea or vomiting.   oxyCODONE 5 MG  immediate release tablet Commonly known as:  Oxy IR/ROXICODONE Take 1 tablet (5 mg total) by mouth every 4 (four) hours as needed.   Vitamin D (Ergocalciferol) 1.25 MG (50000 UT) Caps capsule Commonly known as:  DRISDOL Take 1 capsule (50,000 Units total) by mouth 2 (two) times a week.            Durable Medical Equipment  (From admission, onward)         Start     Ordered   07/16/18 1401  For home use only DME Bedside commode  Once    Question:  Patient needs a bedside commode to treat with the following condition  Answer:  Bimalleolar ankle fracture, right, closed, initial encounter   07/16/18 1401          Diagnostic Studies: Dg Ankle Complete Left  Result Date: 07/06/2018 CLINICAL DATA:  Fall.  Ankle pain. EXAM: LEFT ANKLE COMPLETE - 3+ VIEW COMPARISON:  No recent prior. FINDINGS: Slightly displaced left lateral malleolar fracture present. Medial malleolus is intact. No other focal abnormality identified. IMPRESSION: Slightly displaced left lateral malleolar fracture. Electronically Signed   By: Maisie Fus  Register   On: 07/06/2018 16:28   Dg Ankle Complete Right  Result Date: 07/06/2018 CLINICAL DATA:  Right ankle pain after fall. EXAM: RIGHT ANKLE - COMPLETE 3+ VIEW COMPARISON:  None. FINDINGS: Acute, nondisplaced transverse fracture of the medial malleolus. Acute, mildly displaced oblique fracture of the distal fibula. Acute, mildly distracted fracture of the posterior malleolus. The ankle mortise is symmetric. The talar dome is intact. Joint spaces are preserved. Bone mineralization is normal. Diffuse soft tissue swelling about the ankle. IMPRESSION: Acute trimalleolar fracture as described above. Electronically Signed   By: Obie Dredge M.D.   On: 07/06/2018 16:32   Dg Ankle Right Port  Result Date: 07/15/2018 CLINICAL DATA:  Postop right ankle. EXAM: PORTABLE RIGHT ANKLE - 2 VIEW COMPARISON:  07/06/2018. FINDINGS: Patient is in a cast. Soft tissue swelling. Surgical  staples noted. Plate and screw fixation of the fibula and medial malleolus noted. Hardware intact. Anatomic alignment. IMPRESSION: Patient is in a cast. Soft tissue swelling. Surgical staples noted. ORIF distal fibula and medial malleolus. Hardware intact. Electronically Signed   By: Maisie Fus  Register   On: 07/15/2018 17:25    Disposition: Discharge disposition: 01-Home or Self Care       Discharge Instructions    Call MD / Call 911   Complete by:  As directed    If you experience chest pain or shortness of breath, CALL 911 and be transported to the hospital emergency room.  If you develope a fever above 101 F, pus (white drainage) or increased drainage or redness at the wound, or calf pain, call your surgeon's office.   Constipation Prevention  Complete by:  As directed    Drink plenty of fluids.  Prune juice may be helpful.  You may use a stool softener, such as Colace (over the counter) 100 mg twice a day.  Use MiraLax (over the counter) for constipation as needed.   Diet - low sodium heart healthy   Complete by:  As directed    Discharge instructions   Complete by:  As directed    Keep dressing and splint on and clean and dry until follow-up in the office with Dr. Martha ClanKrasinski in 7 to 10 days.  Continue strict elevation of the right lower extremity.  Patient may apply ice to the right ankle.  Patient is nonweightbearing on the right lower extremity.  She may weight-bear as tolerated on the left lower extremity in her boot.  Patient should take enteric-coated aspirin 325 mg p.o. twice daily for DVT prophylaxis.   Increase activity slowly as tolerated   Complete by:  As directed          Signed: Juanell FairlyKRASINSKI, Deshauna Cayson ,MD 07/16/2018, 2:11 PM

## 2018-07-16 NOTE — Progress Notes (Addendum)
Subjective:  POD #1 s/p right trimalleolar ankle fracture ORIF.  Patient reports right ankle pain as mild to moderate.   Pain is improved compared to postop last night.  Objective:   VITALS:   Vitals:   07/16/18 0356 07/16/18 0831 07/16/18 0950 07/16/18 1102  BP: 128/78 109/72  114/75  Pulse: 72 76 (!) 108 91  Resp: 18 18    Temp:  98.3 F (36.8 C)    TempSrc:  Oral    SpO2: 97% 96% 96%   Weight:      Height:        PHYSICAL EXAM: Right lower extremity: Neurovascular intact Sensation intact distally Intact pulses distally Dorsiflexion/Plantar flexion intact Incision: dressing C/D/I No cellulitis present Compartment soft  LABS  Results for orders placed or performed during the hospital encounter of 07/15/18 (from the past 24 hour(s))  CBC     Status: Abnormal   Collection Time: 07/16/18  4:36 AM  Result Value Ref Range   WBC 12.1 (H) 4.0 - 10.5 K/uL   RBC 3.56 (L) 3.87 - 5.11 MIL/uL   Hemoglobin 11.4 (L) 12.0 - 15.0 g/dL   HCT 16.134.2 (L) 09.636.0 - 04.546.0 %   MCV 96.1 80.0 - 100.0 fL   MCH 32.0 26.0 - 34.0 pg   MCHC 33.3 30.0 - 36.0 g/dL   RDW 40.912.4 81.111.5 - 91.415.5 %   Platelets 298 150 - 400 K/uL   nRBC 0.0 0.0 - 0.2 %  Basic metabolic panel     Status: Abnormal   Collection Time: 07/16/18  4:36 AM  Result Value Ref Range   Sodium 138 135 - 145 mmol/L   Potassium 4.4 3.5 - 5.1 mmol/L   Chloride 107 98 - 111 mmol/L   CO2 24 22 - 32 mmol/L   Glucose, Bld 152 (H) 70 - 99 mg/dL   BUN 13 6 - 20 mg/dL   Creatinine, Ser 7.820.74 0.44 - 1.00 mg/dL   Calcium 8.6 (L) 8.9 - 10.3 mg/dL   GFR calc non Af Amer >60 >60 mL/min   GFR calc Af Amer >60 >60 mL/min   Anion gap 7 5 - 15    Dg Ankle Right Port  Result Date: 07/15/2018 CLINICAL DATA:  Postop right ankle. EXAM: PORTABLE RIGHT ANKLE - 2 VIEW COMPARISON:  07/06/2018. FINDINGS: Patient is in a cast. Soft tissue swelling. Surgical staples noted. Plate and screw fixation of the fibula and medial malleolus noted. Hardware intact.  Anatomic alignment. IMPRESSION: Patient is in a cast. Soft tissue swelling. Surgical staples noted. ORIF distal fibula and medial malleolus. Hardware intact. Electronically Signed   By: Maisie Fushomas  Register   On: 07/15/2018 17:25    Assessment/Plan: 1 Day Post-Op   Active Problems: Trimalleolar ankle fracture, right, closed, initial encounter  Ms. Jackolyn ConferHungerford is doing much better in regards to her pain.  She feels that she is ready to go home.  Her husband is at the bedside.  I explained to her that she needs to continue strict elevation of the right lower extremity at home.  Is not to weight-bear on the right lower extremity.  She is a wheelchair at home is being ordered for a rolling walker and bedside commode per PT and OT evaluation today.  She will be set up for home health PT. will continue using walking boot on her left lower extremity and may weight-bear as tolerated on that side.      Juanell FairlyKRASINSKI, Jaynia Fendley , MD 07/16/2018, 2:01 PM

## 2018-07-16 NOTE — Progress Notes (Signed)
DISCHARGE NOTE:  Pt given discharge instructions and (oxycodone script). Pt verbalized understanding. Pts walker and BSC delivered to room and sent with pt. Pt wheeled to car by staff. Husband providing transportation

## 2018-07-16 NOTE — Evaluation (Signed)
Physical Therapy Evaluation Patient Details Name: Darlene Escobar MRN: [id number] DOB: 10-Sep-1975 Today's Date: 07/16/2018   History of Present Illness  Darlene Escobar is a 42yo female who comes to [facility] for elective ORIF of a Rt bimalleolar fracture. Pt sustained bilat ankle fractures last week AMB down her porch steps. LLE is WBAT in a cam rocker, but ortho recommended ORIF on Right.   Clinical Impression  Pt admitted with above diagnosis. Pt currently with functional limitations due to the deficits listed below (see "PT Problem List"). MinGuard assist required early in session for balance impairment, but pt finishing session at supervision level, AMB with RW. Pt struggles to cover household distances with RW d/t fatigue, but is able to take breaks safely and moderate activity as needed. Husband will be available 24/7 at DC to assist PRN. Pt already has a WC and ramp in home. Pt will benefit from skilled PT intervention to progress tolerance of household AMB and fluency of assistive device use in order to maximize independence up return to home.     Follow Up Recommendations Home health PT;Supervision - Intermittent    Equipment Recommendations  Rolling walker with 5" wheels;3in1 (PT)    Recommendations for Other Services       Precautions / Restrictions Precautions Precautions: Fall Restrictions Weight Bearing Restrictions: Yes RLE Weight Bearing: Non weight bearing LLE Weight Bearing: Weight bearing as tolerated(in cam rocker boot)      Mobility  Bed Mobility Overal bed mobility: Independent                Transfers Overall transfer level: Needs assistance Equipment used: Rolling walker (2 wheeled) Transfers: Sit to/from Stand Sit to Stand: Supervision         General transfer comment: LOB coming up from bed; Practiced 5x to allow safe transisiton of hands.   Ambulation/Gait Ambulation/Gait assistance: Min guard Gait Distance (Feet): 80 Feet Assistive  device: Rolling walker (2 wheeled)       General Gait Details: RW RLE hopping gait, 4-5 standing rest breaks d/t fatigue in arms and legs.   Stairs            Wheelchair Mobility    Modified Rankin (Stroke Patients Only)       Balance Overall balance assessment: Needs assistance Sitting-balance support: Single extremity supported Sitting balance-Leahy Scale: Good     Standing balance support: Bilateral upper extremity supported;During functional activity Standing balance-Leahy Scale: Fair                               Pertinent Vitals/Pain Pain Assessment: 0-10 Pain Score: 3  Pain Location: Rt ankle  Pain Descriptors / Indicators: Aching Pain Intervention(s): Limited activity within patient's tolerance;Monitored during session;Premedicated before session    Home Living Family/patient expects to be discharged to:: Private residence Living Arrangements: Spouse/significant other Available Help at Discharge: Family(husband and 2 children (1yo, 3yo) ) Type of Home: House Home Access: Ramped entrance;Stairs to enter     Home Layout: One level Home Equipment: Walker - 4 wheels;Wheelchair - manual Additional Comments: church came out and built a ramp this week. Husband got a WC to get in/out of home.     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      Hand Dominance        Extremity/Trunk Assessment   Upper Extremity Assessment Upper Extremity Assessment: Generalized  weakness;Overall [facility] CenterWFL for tasks assessed    Lower Extremity Assessment Lower Extremity Assessment: Generalized weakness;Overall WFL for tasks assessed       Communication   Communication: No difficulties  Cognition Arousal/Alertness: Awake/alert Behavior During Therapy: WFL for tasks assessed/performed Overall Cognitive Status: Within Functional Limits for tasks assessed                                         General Comments      Exercises     Assessment/Plan    PT Assessment Patient needs continued PT services  PT Problem List Decreased strength;Decreased activity tolerance;Decreased mobility;Decreased balance;Obesity       PT Treatment Interventions DME instruction;Balance training;Gait training;Functional mobility training;Therapeutic activities;Therapeutic exercise    PT Goals (Current goals can be found in the Care Plan section)  Acute Rehab PT Goals Patient Stated Goal: improve AMB tolerance with RW  PT Goal Formulation: With patient Time For Goal Achievement: 07/23/18 Potential to Achieve Goals: Good    Frequency 7X/week   Barriers to discharge        Co-evaluation               AM-PAC PT "6 Clicks" Daily Activity  Outcome Measure Difficulty turning over in bed (including adjusting bedclothes, sheets and blankets)?: A Little Difficulty moving from lying on back to sitting on the side of the bed? : A Little Difficulty sitting down on and standing up from a chair with arms (e.g., wheelchair, bedside commode, etc,.)?: Unable Help needed moving to and from a bed to chair (including a wheelchair)?: A Little Help needed walking in hospital room?: A Lot Help needed climbing 3-5 steps with a railing? : Total 6 Click Score: 13    End of Session Equipment Utilized During Treatment: Gait belt Activity Tolerance: Patient tolerated treatment well;Patient limited by fatigue Patient left: in chair;with call bell/phone within reach;with family/visitor present;Other (comment)(RLE elevated to hear tlevel )   PT Visit Diagnosis: Muscle weakness (generalized) (M62.81);Difficulty in walking, not elsewhere classified (R26.2)    Time: [id number] PT Time Calculation (min) (ACUTE ONLY): 29 min   Charges:   PT Evaluation $PT Eval Low Complexity: 1 Low PT Treatments $Gait Training: 8-22 mins       10:18 AM, 07/16/18 Rosamaria LintsAllan C Lewin Pellow, PT, DPT Physical Therapist - [facility]  [phone] (ASCOM)     Khyren Hing C 07/16/2018, 10:15 AM

## 2018-07-18 ENCOUNTER — Encounter: Payer: Self-pay | Admitting: Orthopedic Surgery

## 2018-07-18 NOTE — Anesthesia Postprocedure Evaluation (Signed)
Anesthesia Post Note  Patient: Darlene Escobar  Procedure(s) Performed: OPEN REDUCTION INTERNAL FIXATION (ORIF) ANKLE FRACTURE (Right Ankle)  Patient location during evaluation: PACU Anesthesia Type: General Level of consciousness: awake and alert and oriented Pain management: pain level controlled Vital Signs Assessment: post-procedure vital signs reviewed and stable Respiratory status: spontaneous breathing Cardiovascular status: blood pressure returned to baseline Anesthetic complications: no     Last Vitals:  Vitals:   07/16/18 1102 07/16/18 1457  BP: 114/75 (!) 148/91  Pulse: 91 92  Resp:  18  Temp:  37.1 C  SpO2:  98%    Last Pain:  Vitals:   07/16/18 1457  TempSrc: Oral  PainSc: 2                  Sira Adsit

## 2018-09-22 ENCOUNTER — Encounter: Payer: Self-pay | Admitting: Obstetrics and Gynecology

## 2018-09-22 ENCOUNTER — Ambulatory Visit (INDEPENDENT_AMBULATORY_CARE_PROVIDER_SITE_OTHER): Payer: BLUE CROSS/BLUE SHIELD | Admitting: Obstetrics and Gynecology

## 2018-09-22 VITALS — BP 128/84 | HR 88 | Ht 63.0 in | Wt 224.0 lb

## 2018-09-22 DIAGNOSIS — H6690 Otitis media, unspecified, unspecified ear: Secondary | ICD-10-CM | POA: Diagnosis not present

## 2018-09-22 MED ORDER — CEFDINIR 300 MG PO CAPS: 300.0000 mg | ORAL_CAPSULE | Freq: Two times a day (BID) | ORAL | 0 refills | Status: DC

## 2018-09-22 MED ORDER — NEOMYCIN-POLYMYXIN-HC 3.5-10000-1 OT SOLN: 4.0000 [drp] | Freq: Three times a day (TID) | OTIC | 0 refills | Status: DC

## 2018-09-22 NOTE — Progress Notes (Signed)
  Subjective:     Patient ID: Darlene Escobar, female   DOB: 1975-11-14, 43 y.o.   MRN: [id number]  HPI Reports mild right ear pain that has worsened over the last 3 days. Does report getting water in that ear a few days before when washing her hair. Denies fever or sinus congestion.  Review of Systems  HENT: Positive for ear pain.   All other systems reviewed and are negative.      Objective:   Physical Exam A&Ox4 Well groomed female in no distress Blood pressure 128/84, pulse 88, height 5\' 3"  (1.6 m), weight 224 lb (101.6 kg), last menstrual period 09/18/2018, not currently breastfeeding. Right ear with red tympanic membranes and purelent drainage noted. Tender on exam. Negative lymphadenopathy    Assessment:     otits media    Plan:     omnecef 300mg  bid x 7 days and cortisporin otic drops prescribed and instructed on use. RTC as needed      Tyrisha Benninger,CNM

## 2019-03-20 ENCOUNTER — Other Ambulatory Visit: Payer: Self-pay

## 2019-03-22 ENCOUNTER — Other Ambulatory Visit: Payer: Self-pay

## 2019-03-22 MED ORDER — SERTRALINE HCL 100 MG PO TABS: 100.0000 mg | ORAL_TABLET | Freq: Every day | ORAL | 0 refills | Status: DC

## 2019-06-06 ENCOUNTER — Other Ambulatory Visit: Payer: Self-pay

## 2019-06-06 DIAGNOSIS — Z20822 Contact with and (suspected) exposure to covid-19: Secondary | ICD-10-CM

## 2019-06-09 LAB — NOVEL CORONAVIRUS, NAA: SARS-CoV-2, NAA: NOT DETECTED

## 2019-07-04 ENCOUNTER — Other Ambulatory Visit: Payer: Self-pay | Admitting: Obstetrics and Gynecology

## 2019-08-08 ENCOUNTER — Other Ambulatory Visit: Payer: Self-pay

## 2019-08-08 DIAGNOSIS — Z20822 Contact with and (suspected) exposure to covid-19: Secondary | ICD-10-CM

## 2019-08-10 LAB — NOVEL CORONAVIRUS, NAA: SARS-CoV-2, NAA: NOT DETECTED

## 2019-08-21 ENCOUNTER — Other Ambulatory Visit: Payer: Self-pay

## 2019-08-21 MED ORDER — SERTRALINE HCL 100 MG PO TABS: 100.0000 mg | ORAL_TABLET | Freq: Every day | ORAL | 0 refills | Status: DC

## 2019-10-09 ENCOUNTER — Emergency Department: Payer: BC Managed Care – PPO

## 2019-10-09 ENCOUNTER — Other Ambulatory Visit: Payer: Self-pay

## 2019-10-09 ENCOUNTER — Emergency Department
Admission: EM | Admit: 2019-10-09 | Discharge: 2019-10-09 | Disposition: A | Discharge status: Home / Self Care | Payer: BC Managed Care – PPO | Attending: Emergency Medicine | Admitting: Emergency Medicine

## 2019-10-09 DIAGNOSIS — J4520 Mild intermittent asthma, uncomplicated: Secondary | ICD-10-CM | POA: Insufficient documentation

## 2019-10-09 DIAGNOSIS — U071 COVID-19: Secondary | ICD-10-CM | POA: Insufficient documentation

## 2019-10-09 DIAGNOSIS — R0602 Shortness of breath: Secondary | ICD-10-CM | POA: Diagnosis present

## 2019-10-09 LAB — COMPREHENSIVE METABOLIC PANEL
ALT: 20 U/L (ref 0–44)
AST: 25 U/L (ref 15–41)
Albumin: 4.7 g/dL (ref 3.5–5.0)
Alkaline Phosphatase: 64 U/L (ref 38–126)
Anion gap: 11 (ref 5–15)
BUN: 14 mg/dL (ref 6–20)
CO2: 21 mmol/L — ABNORMAL LOW (ref 22–32)
Calcium: 9.2 mg/dL (ref 8.9–10.3)
Chloride: 103 mmol/L (ref 98–111)
Creatinine, Ser: 0.84 mg/dL (ref 0.44–1.00)
GFR calc Af Amer: >60 mL/min (ref >60)
GFR calc non Af Amer: >60 mL/min (ref >60)
Glucose, Bld: 107 mg/dL — ABNORMAL HIGH (ref 70–99)
Potassium: 3.9 mmol/L (ref 3.5–5.1)
Sodium: 135 mmol/L (ref 135–145)
Total Bilirubin: 0.5 mg/dL (ref 0.3–1.2)
Total Protein: 8.3 g/dL — ABNORMAL HIGH (ref 6.5–8.1)

## 2019-10-09 LAB — CBC WITH DIFFERENTIAL/PLATELET
Abs Immature Granulocytes: 0.02 10*3/uL (ref 0.00–0.07)
Basophils Absolute: 0 10*3/uL (ref 0.0–0.1)
Basophils Relative: 0 %
Eosinophils Absolute: 0.2 10*3/uL (ref 0.0–0.5)
Eosinophils Relative: 3 %
HCT: 39.7 % (ref 36.0–46.0)
Hemoglobin: 13.5 g/dL (ref 12.0–15.0)
Immature Granulocytes: 0 %
Lymphocytes Relative: 43 %
Lymphs Abs: 2.1 10*3/uL (ref 0.7–4.0)
MCH: 32.3 pg (ref 26.0–34.0)
MCHC: 34 g/dL (ref 30.0–36.0)
MCV: 95 fL (ref 80.0–100.0)
Monocytes Absolute: 0.6 10*3/uL (ref 0.1–1.0)
Monocytes Relative: 13 %
Neutro Abs: 2.1 10*3/uL (ref 1.7–7.7)
Neutrophils Relative %: 41 %
Platelets: 216 10*3/uL (ref 150–400)
RBC: 4.18 MIL/uL (ref 3.87–5.11)
RDW: 12.5 % (ref 11.5–15.5)
WBC: 5 10*3/uL (ref 4.0–10.5)
nRBC: 0 % (ref 0.0–0.2)

## 2019-10-09 LAB — BRAIN NATRIURETIC PEPTIDE: B Natriuretic Peptide: 22 pg/mL (ref 0.0–100.0)

## 2019-10-09 LAB — TROPONIN I (HIGH SENSITIVITY): Troponin I (High Sensitivity): <2 ng/L (ref <18)

## 2019-10-09 LAB — PROCALCITONIN: Procalcitonin: <0.1 ng/mL

## 2019-10-09 LAB — POC SARS CORONAVIRUS 2 AG: SARS Coronavirus 2 Ag: POSITIVE — AB

## 2019-10-09 MED ORDER — ONDANSETRON 4 MG PO TBDP: 4.0000 mg | ORAL_TABLET | Freq: Three times a day (TID) | ORAL | 0 refills | Status: DC | PRN

## 2019-10-09 MED ORDER — PREDNISONE 20 MG PO TABS: 40.0000 mg | ORAL_TABLET | Freq: Every day | ORAL | 0 refills | Status: AC

## 2019-10-09 MED ORDER — ALBUTEROL SULFATE HFA 108 (90 BASE) MCG/ACT IN AERS
2.0000 | INHALATION_SPRAY | Freq: Once | RESPIRATORY_TRACT | Status: AC
  Administered 2019-10-09: 08:00:00 2 via RESPIRATORY_TRACT
  Filled 2019-10-09: qty 6.7

## 2019-10-09 MED ORDER — SODIUM CHLORIDE 0.9 % IV BOLUS
500.0000 mL | Freq: Once | INTRAVENOUS | Status: AC
  Administered 2019-10-09: 500 mL via INTRAVENOUS

## 2019-10-09 MED ORDER — ACETAMINOPHEN 500 MG PO TABS
1000.0000 mg | ORAL_TABLET | Freq: Once | ORAL | Status: AC
  Administered 2019-10-09: 08:00:00 1000 mg via ORAL
  Filled 2019-10-09: qty 2

## 2019-10-09 MED ORDER — BENZONATATE 100 MG PO CAPS: 100.0000 mg | ORAL_CAPSULE | Freq: Three times a day (TID) | ORAL | 0 refills | Status: DC | PRN

## 2019-10-09 MED ORDER — PREDNISONE 20 MG PO TABS
60.0000 mg | ORAL_TABLET | Freq: Once | ORAL | Status: AC
  Administered 2019-10-09: 60 mg via ORAL
  Filled 2019-10-09: qty 3

## 2019-10-09 NOTE — ED Triage Notes (Signed)
Patient to ED for complaint of shortness of breath. Husband is positive for Covid. Patient has tested negative in the last week. States last night it got worse to the point she felt like she had to come in.

## 2019-10-09 NOTE — ED Provider Notes (Addendum)
[facility] Emergency Department Provider Note  ____________________________________________   First MD Initiated Contact with Patient 10/09/19 [phone]     (approximate)  I have reviewed the triage vital signs and the nursing notes.   HISTORY  Chief Complaint Shortness of Breath    HPI Darlene Escobar is a 44 y.o. female with anemia, asthma who comes in with concerns for coronavirus.  Patient's husband was positive for Covid.  Patient has tested negative earlier this week.  Patient states that her symptoms got worse last night so she came to the ER for evaluation.  Patient states that she was tested on Tuesday of last week and was negative.  Her husband was positive on Monday of last week.  Patient states that she has had symptoms for the past few days but noted that last night she developed worsening shortness of breath.  Shortness of breath is moderate, worse at nighttime, nothing makes it better, constant.  Patient states she did a telehealth call and said that she was short of breath and they told her to come to the ER to be evaluated.  Patient denies any other risk factors for PE.  Patient reports her asthma is mild.  Never need to be hospitalized for it.  Occasionally it steroids to help her. Denies missed period.           Past Medical History:  Diagnosis Date  . Anemia    in her 20's  . Anxiety   . GERD (gastroesophageal reflux disease)    with pregnancy  . Mild asthma    well controlled    Patient Active Problem List   Diagnosis Date Noted  . Bimalleolar ankle fracture, right, closed, initial encounter 07/15/2018  . Vitamin D deficiency 05/06/2017  . Obesity (BMI 30-39.9) 12/31/2015  . ASCUS favor benign 08/21/2015    Past Surgical History:  Procedure Laterality Date  . ORIF ANKLE FRACTURE Right 07/15/2018   Procedure: OPEN REDUCTION INTERNAL FIXATION (ORIF) ANKLE FRACTURE;  Surgeon: Thornton Park, MD;  Location: [facility] ORS;  Service:  Orthopedics;  Laterality: Right;  . TONSILLECTOMY     age 78     Prior to Admission medications   Medication Sig Start Date End Date Taking? Authorizing Provider  albuterol (PROVENTIL HFA;VENTOLIN HFA) 108 (90 Base) MCG/ACT inhaler Inhale 2 puffs into the lungs every 6 (six) hours as needed for wheezing or shortness of breath.     [provider]  aspirin EC 325 MG tablet Take 1 tablet (325 mg total) by mouth 2 (two) times daily. Patient not taking: Reported on 09/22/2018 07/15/18   Thornton Park, MD  B Complex Vitamins (B COMPLEX 1 PO) Take 1 capsule by mouth daily.     [provider]  cefdinir (OMNICEF) 300 MG capsule Take 1 capsule (300 mg total) by mouth 2 (two) times daily. 09/22/18   Shambley, Melody N, CNM  cyclobenzaprine (FLEXERIL) 10 MG tablet Take 1 tablet (10 mg total) by mouth at bedtime. Patient not taking: Reported on 06/24/2018 04/06/18   Norval Gable, MD  miconazole (MICATIN) 2 % cream Apply 1 application topically 2 (two) times daily. Patient not taking: Reported on 09/22/2018 06/24/18   Joylene Igo, CNM  Multiple Vitamin (MULTIVITAMIN WITH MINERALS) TABS tablet Take 1 tablet by mouth daily.    [provider]  neomycin-polymyxin-hydrocortisone (CORTISPORIN) OTIC solution Place 4 drops into the right ear 3 (three) times daily. 09/22/18   Shambley, Melody N, CNM  ondansetron (ZOFRAN) 4 MG  tablet Take 1 tablet (4 mg total) by mouth every 8 (eight) hours as needed for nausea or vomiting. Patient not taking: Reported on 09/22/2018 07/15/18   Juanell Fairly, MD  oxyCODONE (OXY IR/ROXICODONE) 5 MG immediate release tablet Take 1 tablet (5 mg total) by mouth every 4 (four) hours as needed. Patient not taking: Reported on 09/22/2018 07/15/18   Juanell Fairly, MD  sertraline (ZOLOFT) 100 MG tablet Take 1 tablet (100 mg total) by mouth daily. NEEDS APPOINTMENT FOR ADDITIONAL REFILLS AFTER THIS ONE. 08/21/19   Doreene Burke, CNM  Vitamin D,  Ergocalciferol, (DRISDOL) 1.25 MG (50000 UT) CAPS capsule TAKE 1 CAPSULE (50,000 UNITS TOTAL) BY MOUTH 2 (TWO) TIMES A WEEK. 07/06/19   Purcell Nails, CNM    Allergies Patient has no known allergies.  Family History  Problem Relation Age of Onset  . Diabetes Mother   . Kidney cancer Mother   . Heart disease Father   . Cancer Neg Hx     Social History Social History   Tobacco Use  . Smoking status: Former Smoker    Packs/day: 1.00    Years: 10.00    Pack years: 10.00    Types: Cigarettes    Quit date: 07/13/2005    Years since quitting: 14.2  . Smokeless tobacco: Never Used  Substance Use Topics  . Alcohol use: Yes    Comment: occ wine  . Drug use: No      Review of Systems Constitutional: No fever/chills Eyes: No visual changes. ENT: No sore throat. Cardiovascular: Positive chest pain Respiratory: Positive for SOB, positive cough Gastrointestinal: No abdominal pain.  No nausea, no vomiting.  No diarrhea.  No constipation. Genitourinary: Negative for dysuria. Musculoskeletal: Negative for back pain. Skin: Negative for rash. Neurological: Negative for headaches, focal weakness or numbness. All other ROS negative ____________________________________________   PHYSICAL EXAM:  VITAL SIGNS: ED Triage Vitals  Enc Vitals Group     BP 10/09/19 0642 131/72     Pulse Rate 10/09/19 0642 (!) 103     Resp 10/09/19 0642 (!) 22     Temp 10/09/19 0642 99 F (37.2 C)     Temp Source 10/09/19 0642 Oral     SpO2 10/09/19 0642 98 %     Weight 10/09/19 0643 217 lb (98.4 kg)     Height 10/09/19 0643 5\' 3"  (1.6 m)     Head Circumference --      Peak Flow --      Pain Score 10/09/19 0643 3     Pain Loc --      Pain Edu? --      Excl. in GC? --     Constitutional: Alert and oriented. Well appearing and in no acute distress. Eyes: Conjunctivae are normal. EOMI. Head: Atraumatic. Nose: No congestion/rhinnorhea. Mouth/Throat: Mucous membranes are moist.   Neck: No  stridor. Trachea Midline. FROM Cardiovascular: Normal rate, regular rhythm. Grossly normal heart sounds.  Good peripheral circulation. Respiratory: Clear lungs, no increased work of breathing Gastrointestinal: Soft and nontender. No distention. No abdominal bruits.  Musculoskeletal: No lower extremity tenderness nor edema.  No joint effusions. Neurologic:  Normal speech and language. No gross focal neurologic deficits are appreciated.  Skin:  Skin is warm, dry and intact. No rash noted. Psychiatric: Mood and affect are normal. Speech and behavior are normal. GU: Deferred   ____________________________________________   LABS (all labs ordered are listed, but only abnormal results are displayed)  Labs Reviewed  COMPREHENSIVE METABOLIC PANEL -  Abnormal; Notable for the following components:      Result Value   CO2 21 (*)    Glucose, Bld 107 (*)    Total Protein 8.3 (*)    All other components within normal limits  POC SARS CORONAVIRUS 2 AG - Abnormal; Notable for the following components:   SARS Coronavirus 2 Ag POSITIVE (*)    All other components within normal limits  CBC WITH DIFFERENTIAL/PLATELET  BRAIN NATRIURETIC PEPTIDE  PROCALCITONIN  POC SARS CORONAVIRUS 2 AG -  ED  POC URINE PREG, ED  TROPONIN I (HIGH SENSITIVITY)  TROPONIN I (HIGH SENSITIVITY)   ____________________________________________   ED ECG REPORT I, Concha Se, the attending physician, personally viewed and interpreted this ECG.  EKG is normal sinus rhythm 94, no ST elevations, no T wave inversions, normal intervals ____________________________________________  RADIOLOGY Vela Prose, personally viewed and evaluated these images (plain radiographs) as part of my medical decision making, as well as reviewing the written report by the radiologist.  ED MD interpretation:  No PNA   Official radiology report(s): DG Chest Portable 1 View  Result Date: 10/09/2019 CLINICAL DATA:  Shortness of breath.   Husband is positive for COVID. EXAM: PORTABLE CHEST 1 VIEW COMPARISON:  None. FINDINGS: Examination is degraded due to patient body habitus and portable technique. Normal cardiac silhouette and mediastinal contours. Veiling opacities overlying the bilateral lower lungs are favored to represent overlying breast tissues. No discrete focal airspace opacities. No pleural effusion or pneumothorax. No evidence of edema. No acute osseous abnormalities. IMPRESSION: No acute cardiopulmonary disease. Specifically, no focal airspace opacities to suggest pneumonia. Further evaluation with a PA and lateral chest radiograph may be obtained as clinically indicated. Electronically Signed   By: Simonne Come M.D.   On: 10/09/2019 07:38    ____________________________________________   PROCEDURES  Procedure(s) performed (including Critical Care):  Procedures   ____________________________________________   INITIAL IMPRESSION / ASSESSMENT AND PLAN / ED COURSE   Darlene Escobar was evaluated in Emergency Department on 10/09/2019 for the symptoms described in the history of present illness. She was evaluated in the context of the global COVID-19 pandemic, which necessitated consideration that the patient might be at risk for infection with the SARS-CoV-2 virus that causes COVID-19. Institutional protocols and algorithms that pertain to the evaluation of patients at risk for COVID-19 are in a state of rapid change based on information released by regulatory bodies including the CDC and federal and state organizations. These policies and algorithms were followed during the patient's care in the ED.     Pt presents with SOB.  Most likely secondary to coronavirus.  Will get testing. PNA-will get xray to evaluation Anemia-CBC to evaluate ACS- will get trops Arrhythmia-Will get EKG and keep on monitor.  COVID- will get testing per algorithm. PE-lower suspicion given no risk factors and other cause more likely.   D-dimer unlikely to be accurate due to her most likely having Covid given positive contact.  She is slightly tachycardic and slightly increased respiratory rate which I suspect is from Covid.  If Covid is negative will consider further testing for this.  Covid test was positive.  I suspect her tachycardia and respiratory rate was secondary to this.  Reevaluated patient and her respiratory rates have improved less than 20 and her heart rate has come down.  Patient looks really well satting 100%.  Labs are reassuring.  No evidence of bacterial infection.  Given patient's asthma history we will give a course  of steroid and encouraged to use her albuterol inhaler.  We discussed return precautions in regards to Covid.  Will get ambulatory sat and discharge patient home  I discussed the provisional nature of ED diagnosis, the treatment so far, the ongoing plan of care, follow up appointments and return precautions with the patient and any family or support people present. They expressed understanding and agreed with the plan, discharged home.     ____________________________________________   FINAL CLINICAL IMPRESSION(S) / ED DIAGNOSES   Final diagnoses:  COVID-19  Mild intermittent asthma, unspecified whether complicated     MEDICATIONS GIVEN DURING THIS VISIT:  Medications  sodium chloride 0.9 % bolus 500 mL (500 mLs Intravenous New Bag/Given 10/09/19 0743)  predniSONE (DELTASONE) tablet 60 mg (60 mg Oral Given 10/09/19 0743)  albuterol (VENTOLIN HFA) 108 (90 Base) MCG/ACT inhaler 2 puff (2 puffs Inhalation Given 10/09/19 0811)  acetaminophen (TYLENOL) tablet 1,000 mg (1,000 mg Oral Given 10/09/19 0743)     ED Discharge Orders         Ordered    ondansetron (ZOFRAN ODT) 4 MG disintegrating tablet  Every 8 hours PRN     10/09/19 0935    benzonatate (TESSALON PERLES) 100 MG capsule  3 times daily PRN     10/09/19 0935    predniSONE (DELTASONE) 20 MG tablet  Daily     10/09/19 0935            Note:  This document was prepared using Dragon voice recognition software and may include unintentional dictation errors.   Concha Se, MD 10/09/19 5809    Concha Se, MD 10/09/19 [phone]

## 2019-10-09 NOTE — Discharge Instructions (Signed)
Take the steroids to help with your asthma.  The Zofran to help with nausea.  The Tessalon Perles to help with coughing.  Return to the ER if he develop worsening shortness of breath.  Stay quarantined.     Person Under Monitoring Name: Darlene Escobar  Location: [address] [place] [address]   Infection Prevention Recommendations for Individuals Confirmed to have, or Being Evaluated for, 2019 Novel Coronavirus (COVID-19) Infection Who Receive Care at Home  Individuals who are confirmed to have, or are being evaluated for, COVID-19 should follow the prevention steps below until a healthcare provider or local or state health department says they can return to normal activities.  Stay home except to get medical care You should restrict activities outside your home, except for getting medical care. Do not go to work, school, or public areas, and do not use public transportation or taxis.  Call ahead before visiting your doctor Before your medical appointment, call the healthcare provider and tell them that you have, or are being evaluated for, COVID-19 infection. This will help the healthcare providers office take steps to keep other people from getting infected. Ask your healthcare provider to call the local or state health department.  Monitor your symptoms Seek prompt medical attention if your illness is worsening (e.g., difficulty breathing). Before going to your medical appointment, call the healthcare provider and tell them that you have, or are being evaluated for, COVID-19 infection. Ask your healthcare provider to call the local or state health department.  Wear a facemask You should wear a facemask that covers your nose and mouth when you are in the same room with other people and when you visit a healthcare provider. People who live with or visit you should also wear a facemask while they are in the same room with you.  Separate yourself from other people in your  home As much as possible, you should stay in a different room from other people in your home. Also, you should use a separate bathroom, if available.  Avoid sharing household items You should not share dishes, drinking glasses, cups, eating utensils, towels, bedding, or other items with other people in your home. After using these items, you should wash them thoroughly with soap and water.  Cover your coughs and sneezes Cover your mouth and nose with a tissue when you cough or sneeze, or you can cough or sneeze into your sleeve. Throw used tissues in a lined trash can, and immediately wash your hands with soap and water for at least 20 seconds or use an alcohol-based hand rub.  Wash your [employer] your hands often and thoroughly with soap and water for at least 20 seconds. You can use an alcohol-based hand sanitizer if soap and water are not available and if your hands are not visibly dirty. Avoid touching your eyes, nose, and mouth with unwashed hands.   Prevention Steps for Caregivers and Household Members of Individuals Confirmed to have, or Being Evaluated for, COVID-19 Infection Being Cared for in the Home  If you live with, or provide care at home for, a person confirmed to have, or being evaluated for, COVID-19 infection please follow these guidelines to prevent infection:  Follow healthcare providers instructions Make sure that you understand and can help the patient follow any healthcare provider instructions for all care.  Provide for the patients basic needs You should help the patient with basic needs in the home and provide support for getting groceries, prescriptions, and  other personal needs.  Monitor the patients symptoms If they are getting sicker, call his or her medical provider and tell them that the patient has, or is being evaluated for, COVID-19 infection. This will help the healthcare providers office take steps to keep other people from getting  infected. Ask the healthcare provider to call the local or state health department.  Limit the number of people who have contact with the patient If possible, have only one caregiver for the patient. Other household members should stay in another home or place of residence. If this is not possible, they should stay in another room, or be separated from the patient as much as possible. Use a separate bathroom, if available. Restrict visitors who do not have an essential need to be in the home.  Keep older adults, very young children, and other sick people away from the patient Keep older adults, very young children, and those who have compromised immune systems or chronic health conditions away from the patient. This includes people with chronic heart, lung, or kidney conditions, diabetes, and cancer.  Ensure good ventilation Make sure that shared spaces in the home have good air flow, such as from an air conditioner or an opened window, weather permitting.  Wash your hands often Wash your hands often and thoroughly with soap and water for at least 20 seconds. You can use an alcohol based hand sanitizer if soap and water are not available and if your hands are not visibly dirty. Avoid touching your eyes, nose, and mouth with unwashed hands. Use disposable paper towels to dry your hands. If not available, use dedicated cloth towels and replace them when they become wet.  Wear a facemask and gloves Wear a disposable facemask at all times in the room and gloves when you touch or have contact with the patients blood, body fluids, and/or secretions or excretions, such as sweat, saliva, sputum, nasal mucus, vomit, urine, or feces.  Ensure the mask fits over your nose and mouth tightly, and do not touch it during use. Throw out disposable facemasks and gloves after using them. Do not reuse. Wash your hands immediately after removing your facemask and gloves. If your personal clothing becomes  contaminated, carefully remove clothing and launder. Wash your hands after handling contaminated clothing. Place all used disposable facemasks, gloves, and other waste in a lined container before disposing them with other household waste. Remove gloves and wash your hands immediately after handling these items.  Do not share dishes, glasses, or other household items with the patient Avoid sharing household items. You should not share dishes, drinking glasses, cups, eating utensils, towels, bedding, or other items with a patient who is confirmed to have, or being evaluated for, COVID-19 infection. After the person uses these items, you should wash them thoroughly with soap and water.  Wash laundry thoroughly Immediately remove and wash clothes or bedding that have blood, body fluids, and/or secretions or excretions, such as sweat, saliva, sputum, nasal mucus, vomit, urine, or feces, on them. Wear gloves when handling laundry from the patient. Read and follow directions on labels of laundry or clothing items and detergent. In general, wash and dry with the warmest temperatures recommended on the label.  Clean all areas the individual has used often Clean all touchable surfaces, such as counters, tabletops, doorknobs, bathroom fixtures, toilets, phones, keyboards, tablets, and bedside tables, every day. Also, clean any surfaces that may have blood, body fluids, and/or secretions or excretions on them. Wear gloves when cleaning  surfaces the patient has come in contact with. Use a diluted bleach solution (e.g., dilute bleach with 1 part bleach and 10 parts water) or a household disinfectant with a label that says EPA-registered for coronaviruses. To make a bleach solution at home, add 1 tablespoon of bleach to 1 quart (4 cups) of water. For a larger supply, add  cup of bleach to 1 gallon (16 cups) of water. Read labels of cleaning products and follow recommendations provided on product labels. Labels  contain instructions for safe and effective use of the cleaning product including precautions you should take when applying the product, such as wearing gloves or eye protection and making sure you have good ventilation during use of the product. Remove gloves and wash hands immediately after cleaning.  Monitor yourself for signs and symptoms of illness Caregivers and household members are considered close contacts, should monitor their health, and will be asked to limit movement outside of the home to the extent possible. Follow the monitoring steps for close contacts listed on the symptom monitoring form.   ? If you have additional questions, contact your local health department or call the epidemiologist on call at [phone] (available 24/7). ? This guidance is subject to change. For the most up-to-date guidance from [facility], please refer to their website: [email or web address]

## 2019-10-09 NOTE — ED Notes (Signed)
NAD noted at time of D/C. Pt denies questions or concerns. Pt ambulatory to the lobby at this time.  

## 2019-10-09 NOTE — ED Notes (Signed)
X-ray at bedside

## 2019-10-09 NOTE — ED Notes (Signed)
Pt repositioned in bed, tissues provided to patient per patient request. Pt denies further needs. Pt with personal cell phone at bedside. Call bell remains within reach at this time.

## 2019-10-09 NOTE — ED Notes (Signed)
Pt provided with inhaler for home use per EDP. Explained to patient 2 puff q 4-6 hrs PRN SOB per EDP as her inhaler at home. Pt states understanding.

## 2019-10-09 NOTE — ED Notes (Signed)
ED Provider at bedside. 

## 2019-12-07 ENCOUNTER — Other Ambulatory Visit: Payer: Self-pay | Admitting: Certified Nurse Midwife

## 2020-01-01 ENCOUNTER — Other Ambulatory Visit (INDEPENDENT_AMBULATORY_CARE_PROVIDER_SITE_OTHER): Payer: BC Managed Care – PPO | Admitting: Certified Nurse Midwife

## 2020-01-01 ENCOUNTER — Telehealth: Payer: Self-pay | Admitting: Certified Nurse Midwife

## 2020-01-01 ENCOUNTER — Other Ambulatory Visit: Payer: Self-pay | Admitting: Certified Nurse Midwife

## 2020-01-01 ENCOUNTER — Other Ambulatory Visit: Payer: Self-pay

## 2020-01-01 DIAGNOSIS — F53 Postpartum depression: Secondary | ICD-10-CM

## 2020-01-01 DIAGNOSIS — O99345 Other mental disorders complicating the puerperium: Secondary | ICD-10-CM

## 2020-01-01 MED ORDER — SERTRALINE HCL 100 MG PO TABS: 100.0000 mg | ORAL_TABLET | Freq: Every day | ORAL | 0 refills | Status: DC

## 2020-01-01 NOTE — Telephone Encounter (Signed)
Patient called in stating she tried to refill on her zoloft but it told her that she needed an upcoming appointment which she has- however she may be pregnant. Patient inquired about how she can stay on her zoloft while being pregnant. Could you please advise?

## 2020-01-01 NOTE — Progress Notes (Signed)
Rx Zoloft, see orders. Sent to pharmacy on file. MyChart message sent to patient.    Gunnar Bulla, CNM [facility], [facility] 01/01/20 3:12 PM

## 2020-01-01 NOTE — Telephone Encounter (Signed)
Answered via MyChart. Thanks, JML

## 2020-01-12 ENCOUNTER — Ambulatory Visit (INDEPENDENT_AMBULATORY_CARE_PROVIDER_SITE_OTHER): Payer: BC Managed Care – PPO | Admitting: Certified Nurse Midwife

## 2020-01-12 ENCOUNTER — Encounter: Payer: Self-pay | Admitting: Certified Nurse Midwife

## 2020-01-12 ENCOUNTER — Other Ambulatory Visit: Payer: Self-pay

## 2020-01-12 VITALS — BP 122/89 | HR 75 | Ht 64.0 in | Wt 241.0 lb

## 2020-01-12 DIAGNOSIS — N926 Irregular menstruation, unspecified: Secondary | ICD-10-CM | POA: Diagnosis not present

## 2020-01-12 LAB — POCT URINE PREGNANCY: Preg Test, Ur: POSITIVE — AB

## 2020-01-12 MED ORDER — SERTRALINE HCL 25 MG PO TABS
25.0000 mg | ORAL_TABLET | Freq: Every day | ORAL | 0 refills | Status: DC
Start: 2020-01-12 — End: 2020-03-22

## 2020-01-12 MED ORDER — PRENATAL VITAMINS 28-0.8 MG PO TABS: 28.0000 mg | ORAL_TABLET | Freq: Every day | ORAL | 11 refills | Status: AC

## 2020-01-12 NOTE — Progress Notes (Signed)
GYN ENCOUNTER NOTE  Subjective:       Darlene Escobar is a 44 y.o. G77P2002 female is here for gynecologic evaluation of the following issues:  1. Missed period 2. Anxiety   Here today for pregnancy confirmation. This pregnancy was planned.   Wishes to wean Zoloft and discuss a different alternative for her anxiety. Currently seeing a counselor monthly. Desires education regarding coping mechanisms.   Denies difficulty breathing or respiratory distress, chest pain, abdominal pain, excessive vaginal bleeding, dysuria, leg pain or swelling  Gynecologic History  Patient's last menstrual period was 12/03/2019.   Contraception: none   Last Pap: 05/05/2017. Results were: Neg  Obstetric History  OB History  Gravida Para Term Preterm AB Living  2 2 2     2   SAB TAB Ectopic Multiple Live Births        0 2    # Outcome Date GA Lbr Len/2nd Weight Sex Delivery Anes PTL Lv  2 Term 11/12/16 [redacted]w[redacted]d  8 lb 13.8 oz (4.02 kg) M Vag-Spont EPI  LIV  1 Term 03/05/15 [redacted]w[redacted]d / 00:17 8 lb 8.5 oz (3.87 kg) M Vag-Spont None  LIV    Past Medical History:  Diagnosis Date  . Anemia    in her 20's  . Anxiety   . GERD (gastroesophageal reflux disease)    with pregnancy  . Mild asthma    well controlled    Past Surgical History:  Procedure Laterality Date  . ORIF ANKLE FRACTURE Right 07/15/2018   Procedure: OPEN REDUCTION INTERNAL FIXATION (ORIF) ANKLE FRACTURE;  Surgeon: 07/17/2018, MD;  Location: [facility] ORS;  Service: Orthopedics;  Laterality: Right;  . TONSILLECTOMY     age 68     Current Outpatient Medications on File Prior to Visit  Medication Sig Dispense Refill  . albuterol (PROVENTIL HFA;VENTOLIN HFA) 108 (90 Base) MCG/ACT inhaler Inhale 1-2 puffs into the lungs every 6 (six) hours as needed for wheezing or shortness of breath.     . cholecalciferol (VITAMIN D3) 25 MCG (1000 UNIT) tablet Take 2,000 Units by mouth 2 (two) times daily.    . Prenatal Vit-Fe Fumarate-FA  (MULTIVITAMIN-PRENATAL) 27-0.8 MG TABS tablet Take 1 tablet by mouth daily at 12 noon.    . sertraline (ZOLOFT) 100 MG tablet Take 1 tablet (100 mg total) by mouth daily. 30 tablet 0   No current facility-administered medications on file prior to visit.    No Known Allergies  Social History   Socioeconomic History  . Marital status: Married    Spouse name: Not on file  . Number of children: Not on file  . Years of education: Not on file  . Highest education level: Not on file  Occupational History  . Not on file  Tobacco Use  . Smoking status: Former Smoker    Packs/day: 1.00    Years: 10.00    Pack years: 10.00    Types: Cigarettes    Quit date: 07/13/2005    Years since quitting: 14.5  . Smokeless tobacco: Never Used  Substance and Sexual Activity  . Alcohol use: Not Currently    Comment: occ wine  . Drug use: No  . Sexual activity: Yes  Other Topics Concern  . Not on file  Social History Narrative  . Not on file   Social Determinants of Health   Financial Resource Strain:   . Difficulty of Paying Living Expenses:   Food Insecurity:   . Worried About 07/15/2005 in the  Last Year:   . [place] in the Last Year:   Transportation Needs:   . Film/video editor (Medical):   Marland Kitchen Lack of Transportation (Non-Medical):   Physical Activity:   . Days of Exercise per Week:   . Minutes of Exercise per Session:   Stress:   . Feeling of Stress :   Social Connections:   . Frequency of Communication with Friends and Family:   . Frequency of Social Gatherings with Friends and Family:   . Attends Religious Services:   . Active Member of Clubs or Organizations:   . Attends Archivist Meetings:   Marland Kitchen Marital Status:   Intimate Partner Violence:   . Fear of Current or Ex-Partner:   . Emotionally Abused:   Marland Kitchen Physically Abused:   . Sexually Abused:     Family History  Problem Relation Age of Onset  . Diabetes Mother   . Kidney cancer Mother   .  Heart disease Father   . Cancer Neg Hx     The following portions of the patient's history were reviewed and updated as appropriate: allergies, current medications, past family history, past medical history, past social history, past surgical history and problem list.  Review of Systems  Review of Systems - negative except as noted above. Information obtained from patient.  Objective:   BP 122/89   Pulse 75   Ht 5\' 4"  (1.626 m)   Wt 241 lb (109.3 kg)   LMP 12/03/2019   BMI 41.37 kg/m    CONSTITUTIONAL: Well-developed, well-nourished female in no acute distress.   PHYSICAL EXAM: Not indicated.   URINE PREGNANCY TEST POSITIVE  GAD 7 : Generalized Anxiety Score 01/12/2020  Nervous, Anxious, on Edge 1  Control/stop worrying 1  Worry too much - different things 1  Trouble relaxing 0  Restless 0  Easily annoyed or irritable 2  Afraid - awful might happen 1  Total GAD 7 Score 6  Anxiety Difficulty Not difficult at all   Depression screen [facility] 2/9 01/12/2020 08/06/2017 08/06/2017 06/25/2017 12/30/2016  Decreased Interest 0 0 0 3 0  Down, Depressed, Hopeless 0 0 0 2 0  PHQ - 2 Score 0 0 0 5 0  Altered sleeping 0 0 0 1 0  Tired, decreased energy 1 1 0 3 1  Change in appetite 1 0 0 3 0  Feeling bad or failure about yourself  0 0 0 3 0  Trouble concentrating 0 0 0 3 0  Moving slowly or fidgety/restless 0 0 0 - 0  Suicidal thoughts 0 0 0 1 0  PHQ-9 Score 2 1 0 19 1  Difficult doing work/chores Not difficult at all Not difficult at all - Extremely dIfficult -    Assessment:   1. Missed menses  - POCT urine pregnancy - Beta hCG quant (ref lab) - Progesterone  Plan:   Start Zoloft taper on Sunday  -50 mg daily for two weeks   -then 25 mg daily for two weeks  -then 25 mg daily every other day until out of medicine.   Encouraged to continue to see counselor.  Labs done: See orders.  Reviewed red flags and when to call the office.  RTC x 2-3 weeks for dating and  viability scan and nurse intake  RTC x 6-7 weeks for ROB PE or sooner if needed.    Fransico Him RN [place] 01/12/20 12:16 PM

## 2020-01-12 NOTE — Progress Notes (Signed)
PT is present today for confirmation of pregnancy. Pt LMP 12/03/2019. UPT done today results were positive. Pt stated that she is doing well no complaints.  PHQ-9=2. GAD-7=6.

## 2020-01-12 NOTE — Patient Instructions (Addendum)
Managing Anxiety, Adult After being diagnosed with an anxiety disorder, you may be relieved to know why you have felt or behaved a certain way. You may also feel overwhelmed about the treatment ahead and what it will mean for your life. With care and support, you can manage this condition and recover from it. How to manage lifestyle changes Managing stress and anxiety  Stress is your body's reaction to life changes and events, both good and bad. Most stress will last just a few hours, but stress can be ongoing and can lead to more than just stress. Although stress can play a major role in anxiety, it is not the same as anxiety. Stress is usually caused by something external, such as a deadline, test, or competition. Stress normally passes after the triggering event has ended.  Anxiety is caused by something internal, such as imagining a terrible outcome or worrying that something will go wrong that will devastate you. Anxiety often does not go away even after the triggering event is over, and it can become long-term (chronic) worry. It is important to understand the differences between stress and anxiety and to manage your stress effectively so that it does not lead to an anxious response. Talk with your health care provider or a counselor to learn more about reducing anxiety and stress. He or she may suggest tension reduction techniques, such as:  Music therapy. This can include creating or listening to music that you enjoy and that inspires you.  Mindfulness-based meditation. This involves being aware of your normal breaths while not trying to control your breathing. It can be done while sitting or walking.  Centering prayer. This involves focusing on a word, phrase, or sacred image that means something to you and brings you peace.  Deep breathing. To do this, expand your stomach and inhale slowly through your nose. Hold your breath for 3-5 seconds. Then exhale slowly, letting your stomach muscles  relax.  Self-talk. This involves identifying thought patterns that lead to anxiety reactions and changing those patterns.  Muscle relaxation. This involves tensing muscles and then relaxing them. Choose a tension reduction technique that suits your lifestyle and personality. These techniques take time and practice. Set aside 5-15 minutes a day to do them. Therapists can offer counseling and training in these techniques. The training to help with anxiety may be covered by some insurance plans. Other things you can do to manage stress and anxiety include:  Keeping a stress/anxiety diary. This can help you learn what triggers your reaction and then learn ways to manage your response.  Thinking about how you react to certain situations. You may not be able to control everything, but you can control your response.  Making time for activities that help you relax and not feeling guilty about spending your time in this way.  Visual imagery and yoga can help you stay calm and relax.  Medicines Medicines can help ease symptoms. Medicines for anxiety include:  Anti-anxiety drugs.  Antidepressants. Medicines are often used as a primary treatment for anxiety disorder. Medicines will be prescribed by a health care provider. When used together, medicines, psychotherapy, and tension reduction techniques may be the most effective treatment. Relationships Relationships can play a big part in helping you recover. Try to spend more time connecting with trusted friends and family members. Consider going to couples counseling, taking family education classes, or going to family therapy. Therapy can help you and others better understand your condition. How to recognize changes in your  anxiety Everyone responds differently to treatment for anxiety. Recovery from anxiety happens when symptoms decrease and stop interfering with your daily activities at home or work. This may mean that you will start to:  Have  better concentration and focus. Worry will interfere less in your daily thinking.  Sleep better.  Be less irritable.  Have more energy.  Have improved memory. It is important to recognize when your condition is getting worse. Contact your health care provider if your symptoms interfere with home or work and you feel like your condition is not improving. Follow these instructions at home: Activity  Exercise. Most adults should do the following: ? Exercise for at least 150 minutes each week. The exercise should increase your heart rate and make you sweat (moderate-intensity exercise). ? Strengthening exercises at least twice a week.  Get the right amount and quality of sleep. Most adults need 7-9 hours of sleep each night. Lifestyle   Eat a healthy diet that includes plenty of vegetables, fruits, whole grains, low-fat dairy products, and lean protein. Do not eat a lot of foods that are high in solid fats, added sugars, or salt.  Make choices that simplify your life.  Do not use any products that contain nicotine or tobacco, such as cigarettes, e-cigarettes, and chewing tobacco. If you need help quitting, ask your health care provider.  Avoid caffeine, alcohol, and certain over-the-counter cold medicines. These may make you feel worse. Ask your pharmacist which medicines to avoid. General instructions  Take over-the-counter and prescription medicines only as told by your health care provider.  Keep all follow-up visits as told by your health care provider. This is important. Where to find support You can get help and support from these sources:  Self-help groups.  Online and [employer].  A trusted spiritual leader.  Couples counseling.  Family education classes.  Family therapy. Where to find more information You may find that joining a support group helps you deal with your anxiety. The following sources can help you locate counselors or support groups near  you:  [place]: www.mentalhealthamerica.net  Anxiety and Depression Association of [place] (ADAA): https://www.clark.net/  National Alliance on Mental Illness (NAMI): www.nami.org Contact a health care provider if you:  Have a hard time staying focused or finishing daily tasks.  Spend many hours a day feeling worried about everyday life.  Become exhausted by worry.  Start to have headaches, feel tense, or have nausea.  Urinate more than normal.  Have diarrhea. Get help right away if you have:  A racing heart and shortness of breath.  Thoughts of hurting yourself or others. If you ever feel like you may hurt yourself or others, or have thoughts about taking your own life, get help right away. You can go to your nearest emergency department or call:  Your local emergency services (911 in the [place]  A suicide crisis helpline, such as the [employer] at 639 866 0935. This is open 24 hours a day. Summary  Taking steps to learn and use tension reduction techniques can help calm you and help prevent triggering an anxiety reaction.  When used together, medicines, psychotherapy, and tension reduction techniques may be the most effective treatment.  Family, friends, and partners can play a big part in helping you recover from an anxiety disorder. This information is not intended to replace advice given to you by your health care provider. Make sure you discuss any questions you have with your health care provider. Document Revised:  01/17/2019 Document Reviewed: 01/17/2019 Elsevier Patient Education  [place].   Common Medications Safe in Pregnancy  Acne:      Constipation:  Benzoyl Peroxide     Colace  Clindamycin      Dulcolax Suppository  Topica Erythromycin     Fibercon  Salicylic Acid      Metamucil         Miralax AVOID:        Senakot   Accutane    Cough:  Retin-A       Cough Drops  Tetracycline      Phenergan w/ Codeine if  Rx  Minocycline      Robitussin (Plain & DM)  Antibiotics:     Crabs/Lice:  Ceclor       RID  Cephalosporins    AVOID:  E-Mycins      Kwell  Keflex  Macrobid/Macrodantin   Diarrhea:  Penicillin      Kao-Pectate  Zithromax      Imodium AD         PUSH FLUIDS AVOID:       Cipro     Fever:  Tetracycline      Tylenol (Regular or Extra  Minocycline       Strength)  Levaquin      Extra Strength-Do not          Exceed 8 tabs/24 hrs Caffeine:        <263m/day (equiv. To 1 cup of coffee or  approx. 3 12 oz sodas)         Gas: Cold/Hayfever:       Gas-X  Benadryl      Mylicon  Claritin       Phazyme  **Claritin-D        Chlor-Trimeton    Headaches:  Dimetapp      ASA-Free Excedrin  Drixoral-Non-Drowsy     Cold Compress  Mucinex (Guaifenasin)     Tylenol (Regular or Extra  Sudafed/Sudafed-12 Hour     Strength)  **Sudafed PE Pseudoephedrine   Tylenol Cold & Sinus     Vicks Vapor Rub  Zyrtec  **AVOID if Problems With Blood Pressure         Heartburn: Avoid lying down for at least 1 hour after meals  Aciphex      Maalox     Rash:  Milk of Magnesia     Benadryl    Mylanta       1% Hydrocortisone Cream  Pepcid  Pepcid Complete   Sleep Aids:  Prevacid      Ambien   Prilosec       Benadryl  Rolaids       Chamomile Tea  Tums (Limit 4/day)     Unisom  Zantac       Tylenol PM         Warm milk-add vanilla or  Hemorrhoids:       Sugar for taste  Anusol/Anusol H.C.  (RX: Analapram 2.5%)  Sugar Substitutes:  Hydrocortisone OTC     Ok in moderation  Preparation H      Tucks        Vaseline lotion applied to tissue with wiping    Herpes:     Throat:  Acyclovir      Oragel  Famvir  Valtrex     Vaccines:         Flu Shot Leg Cramps:       *Gardasil  Benadryl      Hepatitis  A         Hepatitis B Nasal Spray:       Pneumovax  Saline Nasal Spray     Polio Booster         Tetanus Nausea:       Tuberculosis test or PPD  Vitamin B6 25 mg  TID   AVOID:    Dramamine      *Gardasil  Emetrol       Live Poliovirus  Ginger Root 250 mg QID    MMR (measles, mumps &  High Complex Carbs @ Bedtime    rebella)  Sea Bands-Accupressure    Varicella (Chickenpox)  Unisom 1/2 tab TID     *No known complications           If received before Pain:         Known pregnancy;   Darvocet       Resume series after  Lortab        Delivery  Percocet    Yeast:   Tramadol      Femstat  Tylenol 3      Gyne-lotrimin  Ultram       Monistat  Vicodin           MISC:         All Sunscreens           Hair Coloring/highlights          Insect Repellant's          (Including DEET)         Mystic Tans      Morning Sickness  Morning sickness is when you feel sick to your stomach (nauseous) during pregnancy. You may feel sick to your stomach and throw up (vomit). You may feel sick in the morning, but you can feel this way at any time of day. Some women feel very sick to their stomach and cannot stop throwing up (hyperemesis gravidarum). Follow these instructions at home: Medicines  Take over-the-counter and prescription medicines only as told by your doctor. Do not take any medicines until you talk with your doctor about them first.  Taking multivitamins before getting pregnant can stop or lessen the harshness of morning sickness. Eating and drinking  Eat dry toast or crackers before getting out of bed.  Eat 5 or 6 small meals a day.  Eat dry and bland foods like rice and baked potatoes.  Do not eat greasy, fatty, or spicy foods.  Have someone cook for you if the smell of food causes you to feel sick or throw up.  If you feel sick to your stomach after taking prenatal vitamins, take them at night or with a snack.  Eat protein when you need a snack. Nuts, yogurt, and cheese are good choices.  Drink fluids throughout the day.  Try ginger ale made with real ginger, ginger tea made from fresh grated ginger, or ginger candies. General  instructions  Do not use any products that have nicotine or tobacco in them, such as cigarettes and e-cigarettes. If you need help quitting, ask your doctor.  Use an air purifier to keep the air in your house free of smells.  Get lots of fresh air.  Try to avoid smells that make you feel sick.  Try: ? Wearing a bracelet that is used for seasickness (acupressure wristband). ? Going to a doctor who puts thin needles into certain body points (acupuncture) to improve how you feel. Contact a doctor if:  You  need medicine to feel better.  You feel dizzy or light-headed.  You are losing weight. Get help right away if:  You feel very sick to your stomach and cannot stop throwing up.  You pass out (faint).  You have very bad pain in your belly. Summary  Morning sickness is when you feel sick to your stomach (nauseous) during pregnancy.  You may feel sick in the morning, but you can feel this way at any time of day.  Making some changes to what you eat may help your symptoms go away. This information is not intended to replace advice given to you by your health care provider. Make sure you discuss any questions you have with your health care provider. Document Revised: 07/30/2017 Document Reviewed: 09/17/2016 Elsevier Patient Education  2020 [employer].

## 2020-01-12 NOTE — Progress Notes (Signed)
I have seen, interviewed, and examined the patient in conjunction with the [employer] Nurse Practitioner student and affirm the diagnosis and management plan.   Gunnar Bulla, CNM [facility], [facility] 01/12/20 3:25 PM

## 2020-01-13 LAB — BETA HCG QUANT (REF LAB): hCG Quant: 493 m[IU]/mL

## 2020-01-13 LAB — PROGESTERONE: Progesterone: 6.3 ng/mL

## 2020-01-14 ENCOUNTER — Emergency Department: Payer: BC Managed Care – PPO

## 2020-01-14 ENCOUNTER — Other Ambulatory Visit: Payer: Self-pay

## 2020-01-14 ENCOUNTER — Encounter: Payer: Self-pay | Admitting: Emergency Medicine

## 2020-01-14 ENCOUNTER — Emergency Department
Admission: EM | Admit: 2020-01-14 | Discharge: 2020-01-14 | Disposition: A | Discharge status: Home / Self Care | Payer: BC Managed Care – PPO | Attending: Emergency Medicine | Admitting: Emergency Medicine

## 2020-01-14 DIAGNOSIS — O99511 Diseases of the respiratory system complicating pregnancy, first trimester: Secondary | ICD-10-CM | POA: Insufficient documentation

## 2020-01-14 DIAGNOSIS — J45909 Unspecified asthma, uncomplicated: Secondary | ICD-10-CM | POA: Insufficient documentation

## 2020-01-14 DIAGNOSIS — Z87891 Personal history of nicotine dependence: Secondary | ICD-10-CM | POA: Diagnosis not present

## 2020-01-14 DIAGNOSIS — O2391 Unspecified genitourinary tract infection in pregnancy, first trimester: Secondary | ICD-10-CM | POA: Insufficient documentation

## 2020-01-14 DIAGNOSIS — Z3A01 Less than 8 weeks gestation of pregnancy: Secondary | ICD-10-CM | POA: Diagnosis not present

## 2020-01-14 DIAGNOSIS — B9689 Other specified bacterial agents as the cause of diseases classified elsewhere: Secondary | ICD-10-CM | POA: Insufficient documentation

## 2020-01-14 DIAGNOSIS — Z79899 Other long term (current) drug therapy: Secondary | ICD-10-CM | POA: Insufficient documentation

## 2020-01-14 DIAGNOSIS — O4691 Antepartum hemorrhage, unspecified, first trimester: Secondary | ICD-10-CM | POA: Diagnosis present

## 2020-01-14 DIAGNOSIS — R109 Unspecified abdominal pain: Secondary | ICD-10-CM

## 2020-01-14 DIAGNOSIS — R8271 Bacteriuria: Secondary | ICD-10-CM

## 2020-01-14 DIAGNOSIS — O200 Threatened abortion: Secondary | ICD-10-CM | POA: Diagnosis not present

## 2020-01-14 LAB — COMPREHENSIVE METABOLIC PANEL
ALT: 20 U/L (ref 0–44)
AST: 20 U/L (ref 15–41)
Albumin: 4.6 g/dL (ref 3.5–5.0)
Alkaline Phosphatase: 59 U/L (ref 38–126)
Anion gap: 11 (ref 5–15)
BUN: 8 mg/dL (ref 6–20)
CO2: 23 mmol/L (ref 22–32)
Calcium: 9.6 mg/dL (ref 8.9–10.3)
Chloride: 105 mmol/L (ref 98–111)
Creatinine, Ser: 0.7 mg/dL (ref 0.44–1.00)
GFR calc Af Amer: >60 mL/min (ref >60)
GFR calc non Af Amer: >60 mL/min (ref >60)
Glucose, Bld: 94 mg/dL (ref 70–99)
Potassium: 3.7 mmol/L (ref 3.5–5.1)
Sodium: 139 mmol/L (ref 135–145)
Total Bilirubin: 0.7 mg/dL (ref 0.3–1.2)
Total Protein: 7.8 g/dL (ref 6.5–8.1)

## 2020-01-14 LAB — URINALYSIS, COMPLETE (UACMP) WITH MICROSCOPIC
Bacteria, UA: NONE SEEN
Bilirubin Urine: NEGATIVE
Glucose, UA: NEGATIVE mg/dL
Ketones, ur: NEGATIVE mg/dL
Nitrite: NEGATIVE
Protein, ur: NEGATIVE mg/dL
Specific Gravity, Urine: 1.01 (ref 1.005–1.030)
pH: 6 (ref 5.0–8.0)

## 2020-01-14 LAB — CBC
HCT: 38.3 % (ref 36.0–46.0)
Hemoglobin: 12.9 g/dL (ref 12.0–15.0)
MCH: 32.3 pg (ref 26.0–34.0)
MCHC: 33.7 g/dL (ref 30.0–36.0)
MCV: 95.8 fL (ref 80.0–100.0)
Platelets: 295 10*3/uL (ref 150–400)
RBC: 4 MIL/uL (ref 3.87–5.11)
RDW: 12.4 % (ref 11.5–15.5)
WBC: 9.7 10*3/uL (ref 4.0–10.5)
nRBC: 0 % (ref 0.0–0.2)

## 2020-01-14 LAB — HCG, QUANTITATIVE, PREGNANCY: hCG, Beta Chain, Quant, S: 454 m[IU]/mL — ABNORMAL HIGH (ref <5)

## 2020-01-14 LAB — POCT PREGNANCY, URINE: Preg Test, Ur: POSITIVE — AB

## 2020-01-14 LAB — ABO/RH: ABO/RH(D): A POS

## 2020-01-14 LAB — LIPASE, BLOOD: Lipase: 27 U/L (ref 11–51)

## 2020-01-14 MED ORDER — CEPHALEXIN 500 MG PO CAPS
500.0000 mg | ORAL_CAPSULE | Freq: Once | ORAL | Status: AC
  Administered 2020-01-14: 500 mg via ORAL
  Filled 2020-01-14: qty 1

## 2020-01-14 MED ORDER — CEPHALEXIN 500 MG PO CAPS: 500.0000 mg | ORAL_CAPSULE | Freq: Three times a day (TID) | ORAL | 0 refills | Status: AC

## 2020-01-14 MED ORDER — SODIUM CHLORIDE 0.9% FLUSH: 3.0000 mL | Freq: Once | INTRAVENOUS | Status: DC

## 2020-01-14 NOTE — ED Notes (Signed)
First Nurse Note: Pt to ED via POV c/o right sided abd pain and vaginal bleeding. Pt is [redacted] weeks pregnant. Pt is in NAD.

## 2020-01-14 NOTE — Discharge Instructions (Signed)
You had a very early pregnancy on your ultrasound.  You will need to check your hCG level in 2 days to make sure it is increasing appropriately in order to make sure you do not have an ectopic pregnancy.  You may have a urinary tract infection so please take Keflex 3 times a day for 5 days.  We sent off urine culture and your OB doctor should be able to follow-up the results when you go for your hCG check.  Please stay hydrated.  Return to ER if you have worse vaginal bleeding or discharge, passing clots or tissues, fever or vomiting.

## 2020-01-14 NOTE — ED Provider Notes (Signed)
[facility] EMERGENCY DEPARTMENT Provider Note   CSN: [id number] Arrival date & time: 01/14/20  1834     History Chief Complaint  Patient presents with  . Abdominal Pain    [redacted] weeks pregnant    Natalie Mceuen is a 44 y.o. female G3 P2 about [redacted] weeks pregnant by dates here presenting with vaginal bleeding. Patient states that her last menstrual period was beginning April.  She states that she had a positive pregnancy test about a week ago.  She went to OB 2 days ago and confirmed a positive pregnancy test but did not have an ultrasound. She states that since yesterday she has some spotting when she wipes.  Denies any actual vaginal bleeding or discharge. Denies any lower abdominal pain. Patient was concerned that she may have an ectopic pregnancy.  Patient also has been having right upper quadrant pain for the last several weeks.  States that it is not worse with food and not associated with any vomiting.  The history is provided by the patient.       Past Medical History:  Diagnosis Date  . Anemia    in her 20's  . Anxiety   . GERD (gastroesophageal reflux disease)    with pregnancy  . Mild asthma    well controlled    Patient Active Problem List   Diagnosis Date Noted  . Bimalleolar ankle fracture, right, closed, initial encounter 07/15/2018  . Vitamin D deficiency 05/06/2017  . Obesity (BMI 30-39.9) 12/31/2015  . ASCUS favor benign 08/21/2015    Past Surgical History:  Procedure Laterality Date  . ORIF ANKLE FRACTURE Right 07/15/2018   Procedure: OPEN REDUCTION INTERNAL FIXATION (ORIF) ANKLE FRACTURE;  Surgeon: Juanell Fairly, MD;  Location: [facility] ORS;  Service: Orthopedics;  Laterality: Right;  . TONSILLECTOMY     age 58      OB History    Gravida  3   Para  2   Term  2   Preterm      AB      Living  2     SAB      TAB      Ectopic      Multiple  0   Live Births  2           Family History  Problem Relation Age  of Onset  . Diabetes Mother   . Kidney cancer Mother   . Heart disease Father   . Cancer Neg Hx     Social History   Tobacco Use  . Smoking status: Former Smoker    Packs/day: 1.00    Years: 10.00    Pack years: 10.00    Types: Cigarettes    Quit date: 07/13/2005    Years since quitting: 14.5  . Smokeless tobacco: Never Used  Substance Use Topics  . Alcohol use: Not Currently    Comment: occ wine  . Drug use: No    Home Medications Prior to Admission medications   Medication Sig Start Date End Date Taking? Authorizing Provider  albuterol (PROVENTIL HFA;VENTOLIN HFA) 108 (90 Base) MCG/ACT inhaler Inhale 1-2 puffs into the lungs every 6 (six) hours as needed for wheezing or shortness of breath.     [provider]  cholecalciferol (VITAMIN D3) 25 MCG (1000 UNIT) tablet Take 2,000 Units by mouth 2 (two) times daily.    [provider]  Prenatal Vit-Fe Fumarate-FA (MULTIVITAMIN-PRENATAL) 27-0.8 MG TABS tablet Take 1 tablet by mouth daily at  12 noon.    [provider]  Prenatal Vit-Fe Fumarate-FA (PRENATAL VITAMINS) 28-0.8 MG TABS Take 28 mg by mouth daily. 01/12/20   Lawhorn, Lara Mulch, CNM  sertraline (ZOLOFT) 100 MG tablet Take 1 tablet (100 mg total) by mouth daily. 01/01/20   Lawhorn, Lara Mulch, CNM  sertraline (ZOLOFT) 25 MG tablet Take 1 tablet (25 mg total) by mouth daily. 01/12/20   Diona Fanti, CNM    Allergies    Patient has no known allergies.  Review of Systems   Review of Systems  Gastrointestinal: Positive for abdominal pain.  Genitourinary: Positive for vaginal bleeding.  All other systems reviewed and are negative.   Physical Exam Updated Vital Signs BP 96/65   Pulse 75   Temp 98.2 F (36.8 C) (Oral)   Resp 16   Ht 5\' 4"  (1.626 m)   Wt 109.3 kg   LMP 12/03/2019 (Exact Date)   SpO2 99%   BMI 41.37 kg/m   Physical Exam Vitals reviewed.  HENT:     Head: Normocephalic.     Mouth/Throat:      Mouth: Mucous membranes are moist.  Eyes:     Extraocular Movements: Extraocular movements intact.  Cardiovascular:     Rate and Rhythm: Normal rate.     Heart sounds: Normal heart sounds.  Pulmonary:     Effort: Pulmonary effort is normal.     Breath sounds: Normal breath sounds.  Abdominal:     General: Abdomen is flat. Bowel sounds are normal.     Comments: Mild RUQ and epigastric tenderness, neg murphy's   Skin:    General: Skin is warm.  Neurological:     General: No focal deficit present.     Mental Status: She is alert.  Psychiatric:        Mood and Affect: Mood normal.     ED Results / Procedures / Treatments   Labs (all labs ordered are listed, but only abnormal results are displayed) Labs Reviewed  URINALYSIS, COMPLETE (UACMP) WITH MICROSCOPIC - Abnormal; Notable for the following components:      Result Value   Color, Urine YELLOW (*)    APPearance HAZY (*)    Hgb urine dipstick LARGE (*)    Leukocytes,Ua MODERATE (*)    All other components within normal limits  HCG, QUANTITATIVE, PREGNANCY - Abnormal; Notable for the following components:   hCG, Beta Chain, Quant, S 454 (*)    All other components within normal limits  POCT PREGNANCY, URINE - Abnormal; Notable for the following components:   Preg Test, Ur POSITIVE (*)    All other components within normal limits  LIPASE, BLOOD  COMPREHENSIVE METABOLIC PANEL  CBC  POC URINE PREG, ED  ABO/RH    EKG None  Radiology US OB LESS THAN 14 WEEKS WITH OB TRANSVAGINAL  Result Date: 01/14/2020 CLINICAL DATA:  Right-sided pain with spotting EXAM: OBSTETRIC <14 WK [place] AND TRANSVAGINAL OB US TECHNIQUE: Both transabdominal and transvaginal ultrasound examinations were performed for complete evaluation of the gestation as well as the maternal uterus, adnexal regions, and pelvic cul-de-sac. Transvaginal technique was performed to assess early pregnancy. COMPARISON:  None. FINDINGS: Intrauterine gestational sac: Small  irregular fluid collection within the uterus. Yolk sac:  Questionable yolk sac present. Embryo:  Not seen MSD: 3.9 mm mm   5 w   1 d Subchorionic hemorrhage:  None visualized. Maternal uterus/adnexae: Ovaries are within normal limits. Right ovary measures 2.7 x 1.7 x 2 cm and  contains a corpus luteum. The left ovary measures 2.8 x 1.8 by 1.8 cm. No significant free fluid. IMPRESSION: Possible early intrauterine gestational sac, but no definitive yolk sac, fetal pole, or cardiac activity yet visualized. Recommend follow-up quantitative B-HCG levels and follow-up US in 14 days to confirm and assess viability. This recommendation follows SRU consensus guidelines: Diagnostic Criteria for Nonviable Pregnancy Early in the First Trimester. Malva Limes Med 2013; 654:6503-54. Electronically Signed   By: Jasmine Pang M.D.   On: 01/14/2020 20:51    Procedures Procedures (including critical care time)  Medications Ordered in ED Medications - No data to display  ED Course  I have reviewed the triage vital signs and the nursing notes.  Pertinent labs & imaging results that were available during my care of the patient were reviewed by me and considered in my medical decision making (see chart for details).    MDM Rules/Calculators/A&P                      Sakiyah Upperman is a 44 y.o. female [redacted] weeks pregnant with blood when she wipes.  No lower abdominal tenderness.  Patient does have intermittent right upper quadrant pain but no CVA tenderness or Murphy sign.  She has normal white blood cell count and normal LFTs.  Her hCG is slightly elevated at 450 .  Her ultrasound showed a possible yolk sac and the uterus but there may be too early to tell .  She is A positive so doesn't need rhogam. she deferred a pelvic exam right now. Her urine showed possible UTI.  We will give a course of Keflex and will send off urine culture and have her follow-up in 2 days for hCG check.  Final Clinical Impression(s) / ED Diagnoses  Final diagnoses:  Right sided abdominal pain    Rx / DC Orders ED Discharge Orders    None       Charlynne Pander, MD 01/14/20 2249

## 2020-01-14 NOTE — ED Triage Notes (Addendum)
Pt arrived via POV with reports of being [redacted] weeks pregnant, had confirmation on Friday.  Pt states yesterday morning she started spotting, pt states she has not needed to wear a pad, pt states when she wipes there is blood.  Pt c/o right side abd pain.  Pt denies N/V.   Pt goes to [facility].  G-3 P-2

## 2020-01-14 NOTE — ED Notes (Signed)
Pt states she is [redacted] weeks pregnant and yesterday she started to have RUQ pain. Pt states when she goes to the bathroom and wipes, pt notes blood on toilet paper. Pt states no blood noted in toilet bowel when using the bathroom, just when on toilet paper. Husband at bedside.

## 2020-01-15 ENCOUNTER — Other Ambulatory Visit: Payer: Self-pay | Admitting: Certified Nurse Midwife

## 2020-01-15 MED ORDER — PROGESTERONE 200 MG PO CAPS: 200.0000 mg | ORAL_CAPSULE | Freq: Every day | ORAL | 3 refills | Status: DC

## 2020-01-16 ENCOUNTER — Other Ambulatory Visit: Payer: BC Managed Care – PPO

## 2020-01-16 ENCOUNTER — Other Ambulatory Visit: Payer: Self-pay

## 2020-01-16 DIAGNOSIS — N926 Irregular menstruation, unspecified: Secondary | ICD-10-CM

## 2020-01-16 LAB — URINE CULTURE

## 2020-01-17 ENCOUNTER — Telehealth: Payer: Self-pay | Admitting: Certified Nurse Midwife

## 2020-01-17 DIAGNOSIS — O039 Complete or unspecified spontaneous abortion without complication: Secondary | ICD-10-CM

## 2020-01-17 LAB — BETA HCG QUANT (REF LAB): hCG Quant: 114 m[IU]/mL

## 2020-01-17 NOTE — Telephone Encounter (Signed)
HIPPA approved voicemail left on identified line. Will contact patient via MyChart.    Gunnar Bulla, CNM [facility], [facility] 01/17/20 11:00 AM

## 2020-01-18 NOTE — Telephone Encounter (Signed)
Completed.

## 2020-01-23 ENCOUNTER — Other Ambulatory Visit: Payer: Self-pay | Admitting: Certified Nurse Midwife

## 2020-01-31 ENCOUNTER — Other Ambulatory Visit: Payer: Self-pay

## 2020-01-31 ENCOUNTER — Other Ambulatory Visit: Payer: BC Managed Care – PPO

## 2020-01-31 DIAGNOSIS — O039 Complete or unspecified spontaneous abortion without complication: Secondary | ICD-10-CM

## 2020-02-01 ENCOUNTER — Other Ambulatory Visit: Payer: BC Managed Care – PPO

## 2020-02-01 LAB — BETA HCG QUANT (REF LAB): hCG Quant: <1 m[IU]/mL

## 2020-02-02 ENCOUNTER — Other Ambulatory Visit (INDEPENDENT_AMBULATORY_CARE_PROVIDER_SITE_OTHER): Payer: BC Managed Care – PPO | Admitting: Certified Nurse Midwife

## 2020-02-02 DIAGNOSIS — Z01419 Encounter for gynecological examination (general) (routine) without abnormal findings: Secondary | ICD-10-CM

## 2020-02-02 DIAGNOSIS — Z1231 Encounter for screening mammogram for malignant neoplasm of breast: Secondary | ICD-10-CM

## 2020-02-02 NOTE — Progress Notes (Signed)
Screening mammogram ordered, see chart.    Gunnar Bulla, CNM [facility], [facility] 02/02/20 4:38 PM

## 2020-02-05 ENCOUNTER — Other Ambulatory Visit: Payer: Self-pay | Admitting: Certified Nurse Midwife

## 2020-02-06 ENCOUNTER — Other Ambulatory Visit: Payer: BC Managed Care – PPO

## 2020-02-20 ENCOUNTER — Other Ambulatory Visit: Payer: Self-pay

## 2020-02-20 ENCOUNTER — Ambulatory Visit
Admission: RE | Admit: 2020-02-20 | Discharge: 2020-02-20 | Disposition: A | Discharge status: Home / Self Care | Payer: BC Managed Care – PPO | Source: Ambulatory Visit | Attending: Certified Nurse Midwife | Admitting: Certified Nurse Midwife

## 2020-02-20 DIAGNOSIS — Z1231 Encounter for screening mammogram for malignant neoplasm of breast: Secondary | ICD-10-CM | POA: Insufficient documentation

## 2020-02-20 DIAGNOSIS — Z01419 Encounter for gynecological examination (general) (routine) without abnormal findings: Secondary | ICD-10-CM | POA: Insufficient documentation

## 2020-02-21 ENCOUNTER — Other Ambulatory Visit (INDEPENDENT_AMBULATORY_CARE_PROVIDER_SITE_OTHER): Payer: BC Managed Care – PPO | Admitting: Certified Nurse Midwife

## 2020-02-21 DIAGNOSIS — O99345 Other mental disorders complicating the puerperium: Secondary | ICD-10-CM

## 2020-02-21 DIAGNOSIS — F53 Postpartum depression: Secondary | ICD-10-CM

## 2020-02-21 MED ORDER — SERTRALINE HCL 100 MG PO TABS: 100.0000 mg | ORAL_TABLET | Freq: Every day | ORAL | 5 refills | Status: DC

## 2020-02-21 NOTE — Progress Notes (Signed)
Rx Zoloft, see orders.   Serafina Royals, CNM [facility], [facility] 02/21/20 1:02 PM

## 2020-03-01 ENCOUNTER — Encounter: Payer: BC Managed Care – PPO | Admitting: Certified Nurse Midwife

## 2020-03-19 ENCOUNTER — Other Ambulatory Visit: Payer: Self-pay | Admitting: Certified Nurse Midwife

## 2020-03-22 ENCOUNTER — Other Ambulatory Visit (HOSPITAL_COMMUNITY)
Admission: RE | Admit: 2020-03-22 | Discharge: 2020-03-22 | Disposition: A | Discharge status: Home / Self Care | Payer: BC Managed Care – PPO | Source: Ambulatory Visit | Attending: Certified Nurse Midwife | Admitting: Certified Nurse Midwife

## 2020-03-22 ENCOUNTER — Other Ambulatory Visit: Payer: Self-pay

## 2020-03-22 ENCOUNTER — Encounter: Payer: Self-pay | Admitting: Certified Nurse Midwife

## 2020-03-22 ENCOUNTER — Ambulatory Visit (INDEPENDENT_AMBULATORY_CARE_PROVIDER_SITE_OTHER): Payer: BC Managed Care – PPO | Admitting: Certified Nurse Midwife

## 2020-03-22 VITALS — BP 114/78 | HR 87 | Ht 64.0 in | Wt 243.6 lb

## 2020-03-22 DIAGNOSIS — Z01419 Encounter for gynecological examination (general) (routine) without abnormal findings: Secondary | ICD-10-CM

## 2020-03-22 DIAGNOSIS — Z124 Encounter for screening for malignant neoplasm of cervix: Secondary | ICD-10-CM | POA: Diagnosis present

## 2020-03-22 NOTE — Patient Instructions (Signed)

## 2020-03-24 NOTE — Progress Notes (Signed)
ANNUAL PREVENTATIVE CARE GYN  ENCOUNTER NOTE  Subjective:       Darlene Escobar is a 44 y.o. G9P2002 female here for a routine annual gynecologic exam.  No current complaints or concerns.   Denies difficulty breathing or respiratory distress, chest pain, abdominal pain, excessive vaginal bleeding, dysuria, and leg pain or swelling.    Gynecologic History  Patient's last menstrual period was 02/16/2020 (exact date).  Contraception: condoms  Last Pap: 2018. Results were: normal  Last mammogram: 01/2020. Results were: normal  Obstetric History  OB History  Gravida Para Term Preterm AB Living  3 2 2     2   SAB TAB Ectopic Multiple Live Births        0 2    # Outcome Date GA Lbr Len/2nd Weight Sex Delivery Anes PTL Lv  3 Gravida           2 Term 11/12/16 [redacted]w[redacted]d  8 lb 13.8 oz (4.02 kg) M Vag-Spont EPI  LIV  1 Term 03/05/15 [redacted]w[redacted]d / 00:17 8 lb 8.5 oz (3.87 kg) M Vag-Spont None  LIV    Past Medical History:  Diagnosis Date  . Anemia    in her 20's  . Anxiety   . GERD (gastroesophageal reflux disease)    with pregnancy  . Mild asthma    well controlled    Past Surgical History:  Procedure Laterality Date  . ORIF ANKLE FRACTURE Right 07/15/2018   Procedure: OPEN REDUCTION INTERNAL FIXATION (ORIF) ANKLE FRACTURE;  Surgeon: 07/17/2018, MD;  Location: [facility] ORS;  Service: Orthopedics;  Laterality: Right;  . TONSILLECTOMY     age 2     Current Outpatient Medications on File Prior to Visit  Medication Sig Dispense Refill  . albuterol (PROVENTIL HFA;VENTOLIN HFA) 108 (90 Base) MCG/ACT inhaler Inhale 1-2 puffs into the lungs every 6 (six) hours as needed for wheezing or shortness of breath.     . cholecalciferol (VITAMIN D3) 25 MCG (1000 UNIT) tablet Take 2,000 Units by mouth 2 (two) times daily.    . Prenatal Vit-Fe Fumarate-FA (PRENATAL VITAMINS) 28-0.8 MG TABS Take 28 mg by mouth daily. 30 tablet 11  . sertraline (ZOLOFT) 100 MG tablet TAKE 1 TABLET BY MOUTH EVERY DAY  90 tablet 0   No current facility-administered medications on file prior to visit.    No Known Allergies  Social History   Socioeconomic History  . Marital status: Married    Spouse name: Not on file  . Number of children: Not on file  . Years of education: Not on file  . Highest education level: Not on file  Occupational History  . Not on file  Tobacco Use  . Smoking status: Former Smoker    Packs/day: 1.00    Years: 10.00    Pack years: 10.00    Types: Cigarettes    Quit date: 07/13/2005    Years since quitting: 14.7  . Smokeless tobacco: Never Used  Vaping Use  . Vaping Use: Never used  Substance and Sexual Activity  . Alcohol use: Not Currently    Comment: occ wine  . Drug use: No  . Sexual activity: Yes  Other Topics Concern  . Not on file  Social History Narrative  . Not on file   Social Determinants of Health   Financial Resource Strain:   . Difficulty of Paying Living Expenses:   Food Insecurity:   . Worried About 07/15/2005 in the Last Year:   . Ran  Out of Food in the Last Year:   Transportation Needs:   . Lack of Transportation (Medical):   Marland Kitchen Lack of Transportation (Non-Medical):   Physical Activity:   . Days of Exercise per Week:   . Minutes of Exercise per Session:   Stress:   . Feeling of Stress :   Social Connections:   . Frequency of Communication with Friends and Family:   . Frequency of Social Gatherings with Friends and Family:   . Attends Religious Services:   . Active Member of Clubs or Organizations:   . Attends Banker Meetings:   Marland Kitchen Marital Status:   Intimate Partner Violence:   . Fear of Current or Ex-Partner:   . Emotionally Abused:   Marland Kitchen Physically Abused:   . Sexually Abused:     Family History  Problem Relation Age of Onset  . Diabetes Mother   . Kidney cancer Mother   . Heart disease Father   . Cancer Neg Hx   . Breast cancer Neg Hx     The following portions of the patient's history were  reviewed and updated as appropriate: allergies, current medications, past family history, past medical history, past social history, past surgical history and problem list.  Review of Systems  ROS negative except as noted above. Information obtained from patient.    Objective:   BP 114/78   Pulse 87   Ht 5\' 4"  (1.626 m)   Wt (!) 243 lb 9 oz (110.5 kg)   LMP 02/16/2020 (Exact Date)   Breastfeeding Unknown   BMI 41.81 kg/m    CONSTITUTIONAL: Well-developed, well-nourished female in no acute distress.   PSYCHIATRIC: Normal mood and affect. Normal behavior. Normal judgment and thought content.  NEUROLGIC: Alert and oriented to person, place, and time. Normal muscle tone coordination. No cranial nerve deficit noted.  HENT:  Normocephalic, atraumatic, External right and left ear normal.   EYES: Conjunctivae and EOM are normal. Pupils are equal and round.   NECK: Normal range of motion, supple, no masses.  Normal thyroid.   SKIN: Skin is warm and dry. No rash noted. Not diaphoretic. No erythema. No pallor.  CARDIOVASCULAR: Normal heart rate noted, regular rhythm, no murmur.  RESPIRATORY: Clear to auscultation bilaterally. Effort and breath sounds normal, no problems with respiration noted.  BREASTS: Symmetric in size. No masses, skin changes, nipple drainage, or lymphadenopathy.  ABDOMEN: Soft, normal bowel sounds, no distention noted.  No tenderness, rebound or guarding.   PELVIC:  External Genitalia: Normal  Vagina: Normal  Cervix: Normal, Pap collected  Uterus: Normal  Adnexa: Normal  MUSCULOSKELETAL: Normal range of motion. No tenderness.  No cyanosis, clubbing, or edema.  2+ distal pulses.  LYMPHATIC: No Axillary, Supraclavicular, or Inguinal Adenopathy.  Assessment:   Annual gynecologic examination 44 y.o.   Contraception: condoms   Obesity 3   Problem List Items Addressed This Visit    None    Visit Diagnoses    Well woman exam    -  Primary   Relevant  Orders   Cytology - PAP   Screening for cervical cancer       Relevant Orders   Cytology - PAP      Plan:   Pap: Pap Co Test  Mammogram: Completed  Labs: Declined  Routine preventative health maintenance measures emphasized: Exercise/Diet/Weight control, Tobacco Warnings, Alcohol/Substance use risks and Stress Management; see AVS  Reviewed red flag symptoms and when to call  Return to Clinic - 1 Year for  ANNUAL EXAM or sooner if needed   Serafina Royals, CNM  [facility], [facility]

## 2020-03-26 LAB — CYTOLOGY - PAP
Comment: NEGATIVE
Diagnosis: NEGATIVE
High risk HPV: NEGATIVE

## 2020-05-03 ENCOUNTER — Other Ambulatory Visit: Payer: Self-pay | Admitting: Certified Nurse Midwife

## 2020-05-03 DIAGNOSIS — Z79899 Other long term (current) drug therapy: Secondary | ICD-10-CM

## 2020-05-03 MED ORDER — NYSTATIN-TRIAMCINOLONE 100000-0.1 UNIT/GM-% EX CREA
1.0000 | TOPICAL_CREAM | Freq: Two times a day (BID) | CUTANEOUS | 0 refills | Status: DC
Start: 2020-05-03 — End: 2023-12-07

## 2020-05-03 MED ORDER — ALBUTEROL SULFATE HFA 108 (90 BASE) MCG/ACT IN AERS: 1.0000 | INHALATION_SPRAY | Freq: Four times a day (QID) | RESPIRATORY_TRACT | 1 refills | Status: DC | PRN

## 2020-05-03 NOTE — Progress Notes (Signed)
Rx Albuterol and Mycolog, see orders   Serafina Royals, CNM [facility], [facility] 05/03/20 2:47 PM

## 2020-06-26 ENCOUNTER — Other Ambulatory Visit: Payer: Self-pay | Admitting: Certified Nurse Midwife

## 2020-12-19 ENCOUNTER — Other Ambulatory Visit: Payer: Self-pay | Admitting: Certified Nurse Midwife

## 2021-01-10 ENCOUNTER — Other Ambulatory Visit: Payer: Self-pay | Admitting: Certified Nurse Midwife

## 2021-01-10 DIAGNOSIS — F53 Postpartum depression: Secondary | ICD-10-CM

## 2021-01-10 MED ORDER — SERTRALINE HCL 25 MG PO TABS
25.0000 mg | ORAL_TABLET | Freq: Every day | ORAL | 0 refills | Status: DC
Start: 2021-01-10 — End: 2021-03-18

## 2021-01-10 NOTE — Progress Notes (Signed)
Rx Zoloft 25 mg, see orders. Patient is tapering dose to discontinue.    Serafina Royals, CNM [facility], [facility] 01/10/21 5:34 PM

## 2021-03-15 ENCOUNTER — Other Ambulatory Visit: Payer: Self-pay | Admitting: Certified Nurse Midwife

## 2021-09-24 ENCOUNTER — Encounter: Payer: Self-pay | Admitting: Emergency Medicine

## 2021-09-24 ENCOUNTER — Emergency Department
Admission: EM | Admit: 2021-09-24 | Discharge: 2021-09-24 | Disposition: A | Discharge status: Home / Self Care | Payer: BC Managed Care – PPO | Attending: Emergency Medicine | Admitting: Emergency Medicine

## 2021-09-24 ENCOUNTER — Emergency Department: Payer: BC Managed Care – PPO

## 2021-09-24 ENCOUNTER — Other Ambulatory Visit: Payer: Self-pay

## 2021-09-24 DIAGNOSIS — M546 Pain in thoracic spine: Secondary | ICD-10-CM | POA: Diagnosis not present

## 2021-09-24 DIAGNOSIS — T148XXA Other injury of unspecified body region, initial encounter: Secondary | ICD-10-CM

## 2021-09-24 DIAGNOSIS — M545 Low back pain, unspecified: Secondary | ICD-10-CM | POA: Diagnosis present

## 2021-09-24 DIAGNOSIS — R0789 Other chest pain: Secondary | ICD-10-CM | POA: Insufficient documentation

## 2021-09-24 DIAGNOSIS — J45909 Unspecified asthma, uncomplicated: Secondary | ICD-10-CM | POA: Diagnosis not present

## 2021-09-24 DIAGNOSIS — M542 Cervicalgia: Secondary | ICD-10-CM | POA: Insufficient documentation

## 2021-09-24 DIAGNOSIS — N9489 Other specified conditions associated with female genital organs and menstrual cycle: Secondary | ICD-10-CM | POA: Insufficient documentation

## 2021-09-24 LAB — D-DIMER, QUANTITATIVE: D-Dimer, Quant: 0.33 ug/mL-FEU (ref 0.00–0.50)

## 2021-09-24 LAB — CBC
HCT: 36.1 % (ref 36.0–46.0)
Hemoglobin: 12.6 g/dL (ref 12.0–15.0)
MCH: 32.2 pg (ref 26.0–34.0)
MCHC: 34.9 g/dL (ref 30.0–36.0)
MCV: 92.3 fL (ref 80.0–100.0)
Platelets: 269 10*3/uL (ref 150–400)
RBC: 3.91 MIL/uL (ref 3.87–5.11)
RDW: 12.3 % (ref 11.5–15.5)
WBC: 6.5 10*3/uL (ref 4.0–10.5)
nRBC: 0 % (ref 0.0–0.2)

## 2021-09-24 LAB — COMPREHENSIVE METABOLIC PANEL
ALT: 16 U/L (ref 0–44)
AST: 24 U/L (ref 15–41)
Albumin: 4.2 g/dL (ref 3.5–5.0)
Alkaline Phosphatase: 53 U/L (ref 38–126)
Anion gap: 7 (ref 5–15)
BUN: 11 mg/dL (ref 6–20)
CO2: 22 mmol/L (ref 22–32)
Calcium: 9.1 mg/dL (ref 8.9–10.3)
Chloride: 107 mmol/L (ref 98–111)
Creatinine, Ser: 0.73 mg/dL (ref 0.44–1.00)
GFR, Estimated: >60 mL/min (ref >60)
Glucose, Bld: 102 mg/dL — ABNORMAL HIGH (ref 70–99)
Potassium: 4.5 mmol/L (ref 3.5–5.1)
Sodium: 136 mmol/L (ref 135–145)
Total Bilirubin: 1 mg/dL (ref 0.3–1.2)
Total Protein: 7.4 g/dL (ref 6.5–8.1)

## 2021-09-24 LAB — TROPONIN I (HIGH SENSITIVITY)
Troponin I (High Sensitivity): <2 ng/L (ref <18)
Troponin I (High Sensitivity): <2 ng/L (ref <18)

## 2021-09-24 LAB — HCG, QUANTITATIVE, PREGNANCY: hCG, Beta Chain, Quant, S: <1 m[IU]/mL (ref <5)

## 2021-09-24 MED ORDER — ACETAMINOPHEN 500 MG PO TABS
1000.0000 mg | ORAL_TABLET | ORAL | Status: AC
  Administered 2021-09-24: 06:00:00 1000 mg via ORAL
  Filled 2021-09-24: qty 2

## 2021-09-24 MED ORDER — CYCLOBENZAPRINE HCL 5 MG PO TABS
7.5000 mg | ORAL_TABLET | ORAL | Status: AC
  Administered 2021-09-24: 06:00:00 7.5 mg via ORAL
  Filled 2021-09-24: qty 1.5

## 2021-09-24 NOTE — ED Triage Notes (Signed)
Patient ambulatory to triage with steady gait, without difficulty or distress noted ; pt reports awakening with pain between shoulder blades radiating into neck with tingling to fingers; st pain increases with deep breathing; 3-81mg  ASA taken PTA

## 2021-09-24 NOTE — ED Notes (Signed)
Pt up to bathroom at this time. NAD. Update given. VSS. Will continue to monitor for changes.

## 2021-09-24 NOTE — ED Provider Notes (Signed)
[facility] Provider Note    Event Date/Time   First MD Initiated Contact with Patient 09/24/21 [phone]     (approximate)   History   Back Pain   HPI  Darlene Escobar is a 46 y.o. female has a past medical history of asthma, previous pregnancies.  Does not believe that she is pregnant today   About 2:00 or 3 AM this morning she woke from sleep with a sudden sharp pain between her shoulder blades along her back that seems to radiate slightly towards her lower back of her neck.  Worsened by movement looking up and down.  There is no chest pain no trouble breathing.  No fevers or chills.  No nausea or vomiting.  No recent illness except she was diagnosed with coronavirus around Christmas time  She took 3 baby aspirin at home and her pain is starting to improve.  She reports that her mom just had a heart attack at age 20, she was concerned about the pain in her back and wants to make sure that she could be having a heart attack  She herself does not have a personal history of heart disease.  No headache.  Felt a brief tingling feeling in 1 or 2 fingers of the right hand not long after the pain started but reports that is completely resolved.  HPI: A 46 year old patient with a history of obesity presents for evaluation of chest pain. Initial onset of pain was approximately 1-3 hours ago. The patient's chest pain is well-localized, is sharp and is not worse with exertion. The patient's chest pain is not middle- or left-sided, is not described as heaviness/pressure/tightness and does radiate to the arms/jaw/neck. The patient does not complain of nausea and denies diaphoresis. The patient has no history of stroke, has no history of peripheral artery disease, has not smoked in the past 90 days, denies any history of treated diabetes, has no relevant family history of coronary artery disease (first degree relative at less than age 55), is not hypertensive and has no  history of hypercholesterolemia.    Physical Exam   Triage Vital Signs: ED Triage Vitals  Enc Vitals Group     BP 09/24/21 0551 121/85     Pulse Rate 09/24/21 0551 89     Resp 09/24/21 0551 16     Temp --      Temp src --      SpO2 09/24/21 0551 98 %     Weight 09/24/21 0543 242 lb (109.8 kg)     Height 09/24/21 0543 5\' 3"  (1.6 m)     Head Circumference --      Peak Flow --      Pain Score 09/24/21 0543 5     Pain Loc --      Pain Edu? --      Excl. in GC? --     Most recent vital signs: Vitals:   09/24/21 0551 09/24/21 0630  BP: 121/85 113/87  Pulse: 89 72  Resp: 16 14  Temp: 98 F (36.7 C)   SpO2: 98% 98%     General: Awake, no distress.  Very pleasant.  Patient and husband at bedside. CV:  Good peripheral perfusion.  Normal heart sounds, no murmurs rubs or gallops.  No tachycardia.  Normal rate.  Well perfused peripherally Resp:  Normal effort.  Clear lung sounds without wheezing.  Speaks in full clear sentences Abd:  No distention.  Negative Murphy.  No pain throughout  all quadrants on abdominal exam.  No epigastric tenderness Other:  No lower extremity tenderness or edema. Patient moves all extremities well with normal strength. Sensory testing performed, patient reports no subjective numbness.  Objectively normal sensation across the hands and arms bilaterally.  Patient has mild tenderness to palpation and isolates pain between the shoulder blades with flexion extension of the neck.  No meningismus.  ED Results / Procedures / Treatments   Labs (all labs ordered are listed, but only abnormal results are displayed) Labs Reviewed  COMPREHENSIVE METABOLIC PANEL - Abnormal; Notable for the following components:      Result Value   Glucose, Bld 102 (*)    All other components within normal limits  CBC  D-DIMER, QUANTITATIVE  HCG, QUANTITATIVE, PREGNANCY  TROPONIN I (HIGH SENSITIVITY)    EKG is reviewed interpreted by me, normal EKG  RADIOLOGY  Chest  x-ray pending at time of signout   PROCEDURES:  Critical Care performed: No  Procedures   MEDICATIONS ORDERED IN ED: Medications  cyclobenzaprine (FLEXERIL) tablet 7.5 mg (7.5 mg Oral Given 09/24/21 0616)  acetaminophen (TYLENOL) tablet 1,000 mg (1,000 mg Oral Given 09/24/21 0616)     IMPRESSION / MDM / ASSESSMENT AND PLAN / ED COURSE  I reviewed the triage vital signs and the nursing notes. HEAR Score: 1         HEAR Score: 1                  Differential diagnosis includes, but is not limited to, musculoskeletal, herniated disc, thoracic musculoskeletal pain, pulmonary embolism and ACS felt to be low risk, no infectious symptoms at present, no cough, unlikely represent pneumonia or infiltrative process.  No notable risk factors for dissection.  No ripping tearing or moving pain.  Patient has reproducible pain located between primarily the scapulary, along the paraspinous regions of the back and upper thoracic neck that refers some pain toward her lower cervical spine.  It is exacerbated by flexion extension of the neck, and I suspect this is likely musculoskeletal in nature but I do wish to screen for other acute etiologies given the location of her pain.  We will utilize troponin in the here pathway, D-dimer, chest x-ray to evaluate for acute lesion or bony abnormality, etc.  Discussed with patient and her husband, comfortable with trial of a muscle relaxant here, her pain is relatively well controlled at this point after having taken aspirin at home.  Patient is not driving herself home, reports she would not plan on driving until at least mid afternoon.  Did discuss the sedated risks of cyclobenzaprine, patient believes she is taken muscle relaxant of similar nature in the past without difficulty  Patient maintained on cardiac monitoring throughout   Independently reviewed the patient's labs      Clinical Course as of 09/24/21 0652  Wed Sep 24, 2021  0549 Reviewed patient's  primary OB/GYN note from July 2021, no major medical issues identified.  [MQ]  0549 EKG is reviewed inter by me at 550 Heart rate 75 QRS 109 QTc 440 Normal sinus rhythm no evidence of ischemia or ectopy [MQ]  0646 Patient resting comfortably at this time.  Reports her pain is about a 2 out of 10, mild.  Does not wish for any additional medication.  She has noticed that the pain seems to be exacerbated by looking up and looking down. [MQ]  [phone] Labs to this point reviewed normal CBC normal initial troponin.  Normal comprehensive metabolic  panel [MQ]    Clinical Course User Index [MQ] Sharyn CreamerQuale, Daiwik Buffalo, MD       Pulmonary Embolism Rule-out Criteria (PERC rule)                        If YES to ANY of the following, the [facility] rule is not satisfied and cannot be used to rule out PE in this patient (consider d-dimer or imaging depending on pre-test probability).                      If NO to ALL of the following, AND the clinician's pre-test probability is <15%, the [facility] rule is satisfied and there is no need for further workup (including no need to obtain a d-dimer) as the post-test probability of pulmonary embolism is <2%.                      Mnemonic is HAD CLOTS   H - hormone use (exogenous estrogen)      No. A - age > 50                                                 No. D - DVT/PE history                                      No.   C - coughing blood (hemoptysis)                 No. L - leg swelling, unilateral                             No. O - O2 Sat on Room Air < 95%                  No. T - tachycardia (HR ? 100)                         No. S - surgery or trauma, recent                      No.   Based on my evaluation of the patient, including application of this decision instrument, further testing to evaluate for pulmonary embolism is not indicated at this time.  However, in the context of her recent coronavirus illness will still screen for risk of pulmonary embolism through use of  D-dimer which is pending at time of signout  HCG pending at time of signout  HEAR Score: 1   ----------------------------------------- 7:04 AM on 09/24/2021 ----------------------------------------- Ongoing care and disposition assigned to my partner Dr. Darnelle CatalanMalinda . Follow-up on further testing for etiology of upper back pain.  At this point seems to be most likely thoracic musculoskeletal etiology, but patient is pending chest x-ray to evaluate for infiltrate mass lesion or other acute abnormality, D-dimer to exclude pulmonary embolism in the setting of his COVID illness within the last month, and second troponin.  If further work-up is reassuring, I would anticipate patient would be appropriate for discharge and to follow-up with her primary care doctor Dr. Mayford KnifeWilliams.  FINAL CLINICAL IMPRESSION(S) / ED DIAGNOSES  Final diagnoses:  None  Back pain, thoracic Musculoskeletal back pain (Ongoing workup, impression at time of ED sign out to Dr. Darnelle CatalanMalinda)   Rx / DC Orders   ED Discharge Orders     None        Note:  This document was prepared using Dragon voice recognition software and may include unintentional dictation errors.   Sharyn CreamerQuale, Byanca Kasper, MD 09/24/21 [phone]

## 2021-09-24 NOTE — ED Notes (Signed)
Pt to XR

## 2021-09-24 NOTE — Discharge Instructions (Addendum)
The pain sounds like it is musculoskeletal.  You can try a heating pad or Motrin or Naprosyn for it.  If you take the Motrin or Naprosyn take it with food.  Do not take it more than a few days.  Do not fall asleep using heating pad.  You can get burns that way. Please return for increasing pain or shortness of breath or fever or feeling sicker.  None of that should happen but I will give you those instructions just in case.

## 2021-09-24 NOTE — ED Notes (Signed)
Pt back from XR 

## 2021-09-24 NOTE — ED Provider Notes (Signed)
Patient's troponins are negative.  Her D-dimer is negative.  Her hCG is negative.  Her chest x-ray looks normal and was read by radiology as being normal.  Patient confirms history of pain getting worse when she moves her head up and down.  There is no tenderness on palpation of her back.  She is not short of breath now the pain is not worse with deep breathing the pain does not radiate anywhere.  It sounds musculoskeletal I will let her go with instructions to use a heating pad or nonsteroidals.   Arnaldo Natal, MD 09/24/21 [phone]

## 2021-12-05 ENCOUNTER — Other Ambulatory Visit: Payer: Self-pay | Admitting: Internal Medicine

## 2021-12-05 DIAGNOSIS — Z1231 Encounter for screening mammogram for malignant neoplasm of breast: Secondary | ICD-10-CM

## 2022-02-03 ENCOUNTER — Ambulatory Visit
Admission: RE | Admit: 2022-02-03 | Discharge: 2022-02-03 | Disposition: A | Discharge status: Home / Self Care | Payer: BC Managed Care – PPO | Source: Ambulatory Visit | Attending: Internal Medicine | Admitting: Internal Medicine

## 2022-02-03 DIAGNOSIS — Z1231 Encounter for screening mammogram for malignant neoplasm of breast: Secondary | ICD-10-CM | POA: Insufficient documentation

## 2022-02-06 ENCOUNTER — Other Ambulatory Visit: Payer: Self-pay | Admitting: Internal Medicine

## 2022-02-06 DIAGNOSIS — R928 Other abnormal and inconclusive findings on diagnostic imaging of breast: Secondary | ICD-10-CM

## 2022-02-09 ENCOUNTER — Other Ambulatory Visit: Payer: Self-pay | Admitting: Internal Medicine

## 2022-02-09 DIAGNOSIS — R928 Other abnormal and inconclusive findings on diagnostic imaging of breast: Secondary | ICD-10-CM

## 2022-02-13 ENCOUNTER — Other Ambulatory Visit: Payer: BC Managed Care – PPO

## 2022-02-18 ENCOUNTER — Ambulatory Visit
Admission: RE | Admit: 2022-02-18 | Discharge: 2022-02-18 | Disposition: A | Discharge status: Home / Self Care | Payer: BC Managed Care – PPO | Source: Ambulatory Visit | Attending: Internal Medicine | Admitting: Internal Medicine

## 2022-02-18 DIAGNOSIS — R928 Other abnormal and inconclusive findings on diagnostic imaging of breast: Secondary | ICD-10-CM | POA: Insufficient documentation

## 2022-03-05 ENCOUNTER — Encounter: Payer: BC Managed Care – PPO | Admitting: Obstetrics

## 2022-03-09 ENCOUNTER — Encounter: Payer: BC Managed Care – PPO | Admitting: Obstetrics

## 2022-06-08 ENCOUNTER — Ambulatory Visit: Payer: BC Managed Care – PPO | Admitting: Dermatology

## 2022-12-07 IMAGING — MG MM DIGITAL DIAGNOSTIC UNILAT*L* W/ TOMO W/ CAD
6 series · 6 of 18 positions shown · non-contrast
Comparison: Previous exam(s).

CLINICAL DATA: Possible focal asymmetry in the anteromedial left
breast on a recent screening mammogram.

EXAM:
DIGITAL DIAGNOSTIC UNILATERAL LEFT MAMMOGRAM WITH TOMOSYNTHESIS AND
CAD; ULTRASOUND LEFT BREAST LIMITED
TECHNIQUE: Left digital diagnostic mammography and breast tomosynthesis was
performed. The images were evaluated with computer-aided detection.;
Targeted ultrasound examination of the left breast was performed.

[L CC synth-2D (1 of 2)]
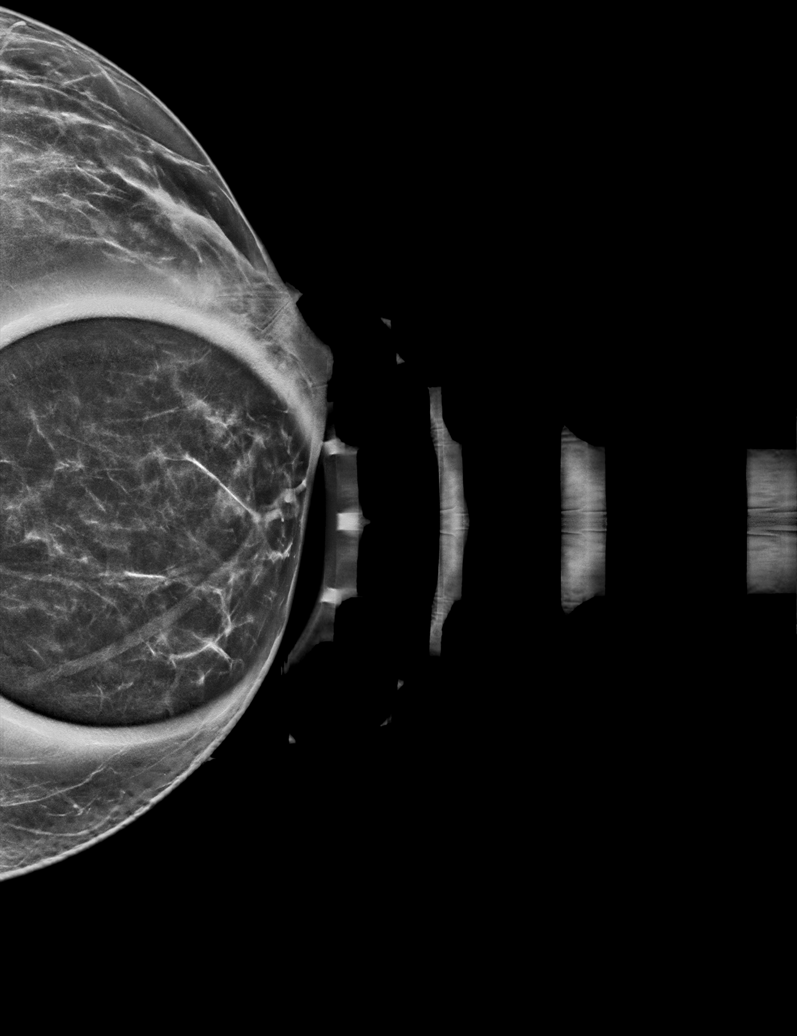

[L MLO synth-2D]
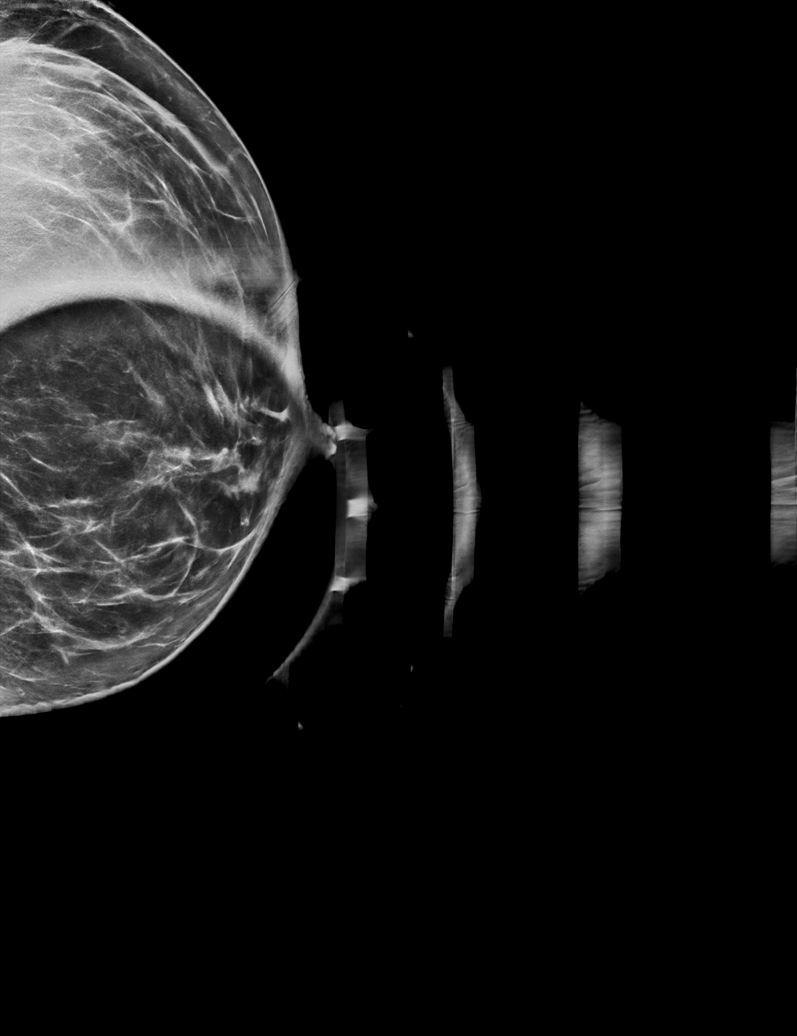

[L CC synth-2D (2 of 2)]
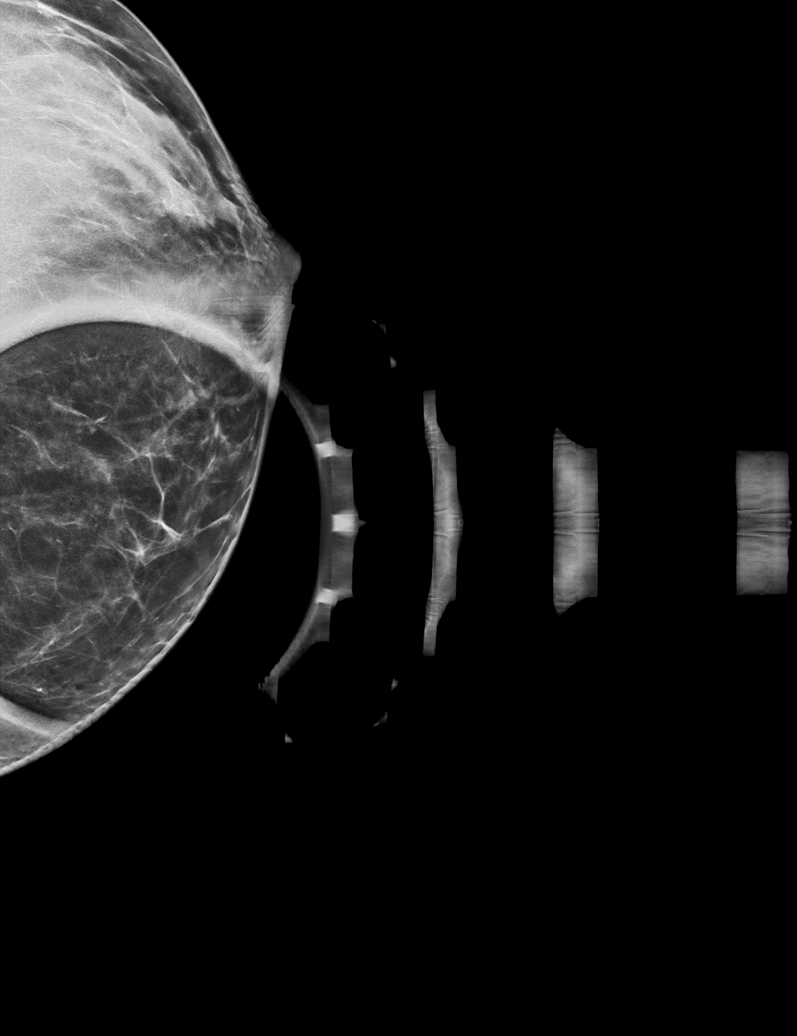

[L CC tomo (1 of 2) · tomo slice 29/58.0]
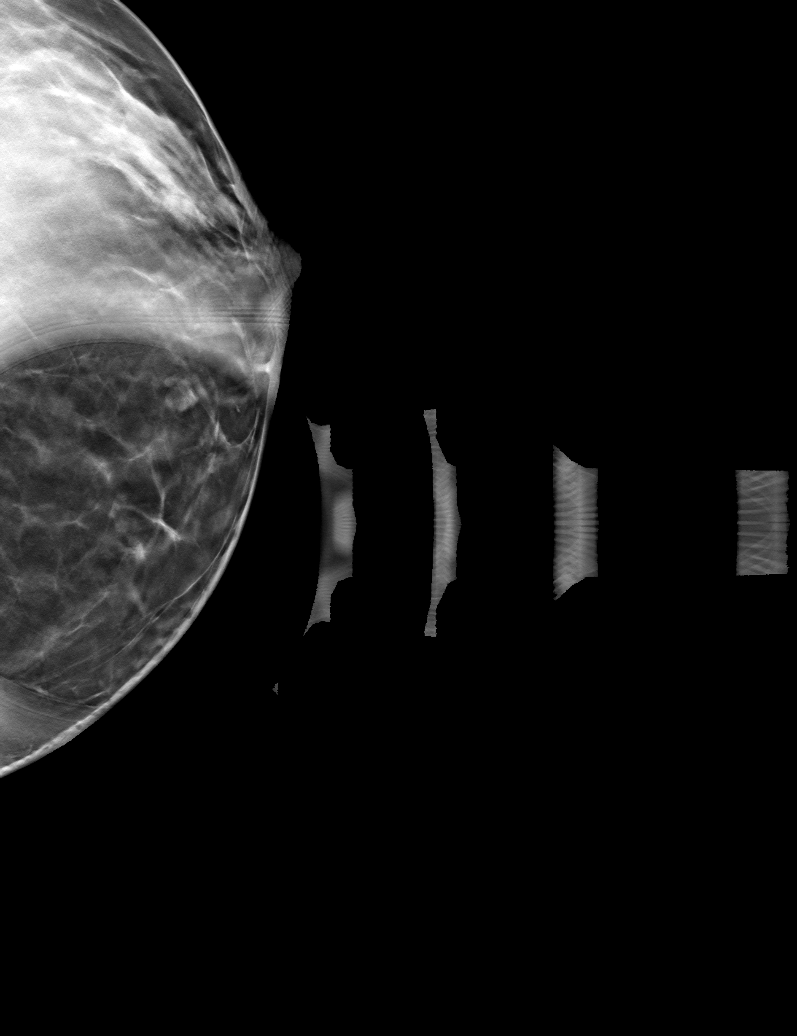

[L MLO tomo · tomo slice 41/80.0]
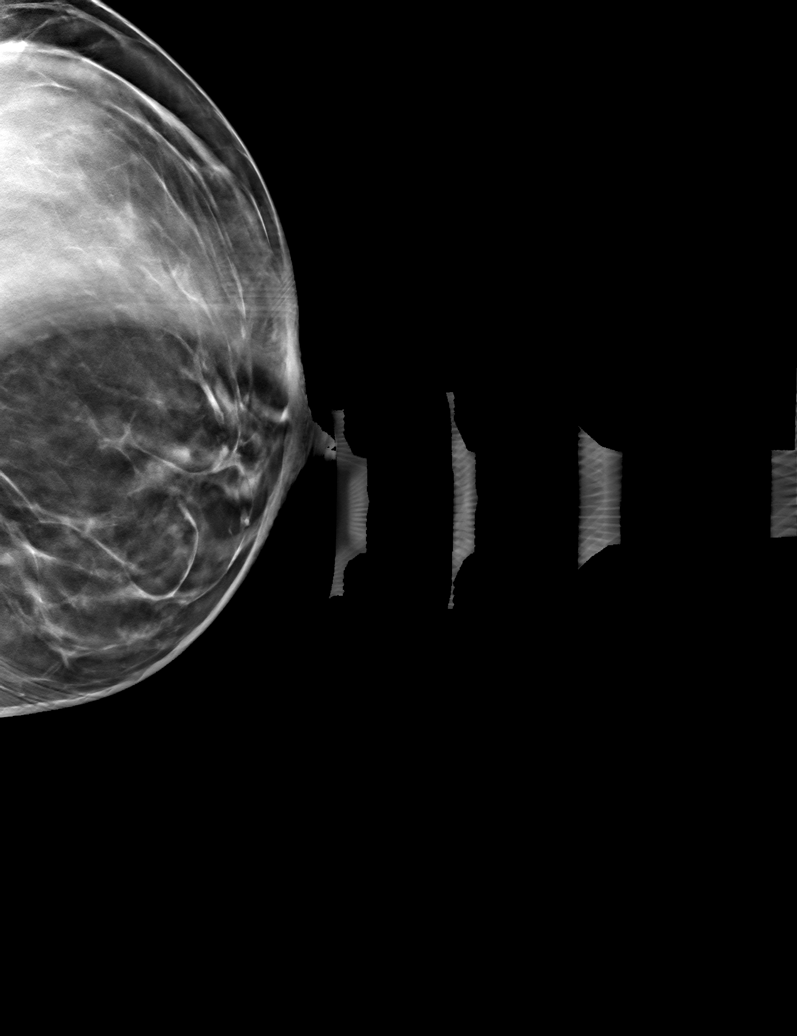

[L CC tomo (2 of 2) · tomo slice 33/65.0]
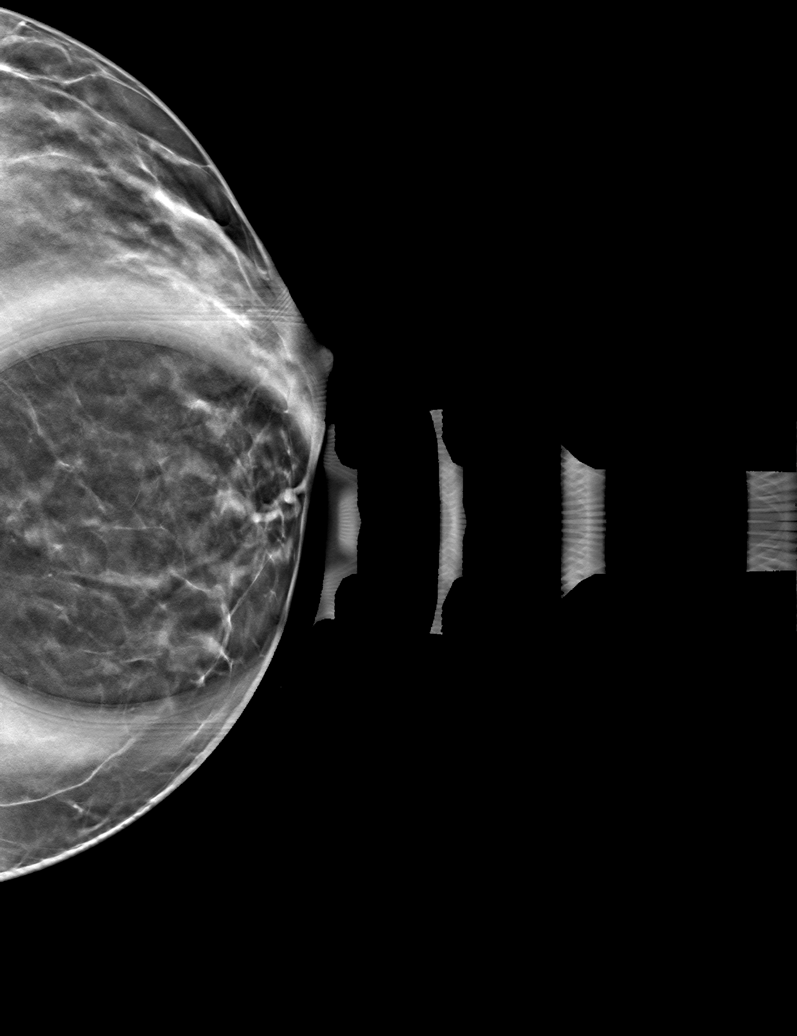

[6 of 18 positions shown; findings below may reference images not displayed]

ACR Breast Density Category b: There are scattered areas of
fibroglandular density.
FINDINGS: 3D tomographic and 2D generated spot compression images of the left
breast confirm an oval, partially circumscribed and partially
indistinct asymmetry in the anteromedial left breast in the
craniocaudal projection, not seen in the oblique projection.

Targeted ultrasound is performed, showing a 9 mm cluster of cysts in
the 8 o'clock position of the left breast, 1 cm from the nipple.
This corresponds to the mammographic asymmetry.
IMPRESSION: Benign left breast cluster of cysts.  No evidence of malignancy.

RECOMMENDATION:
Bilateral screening mammogram in 1 year when due.

I have discussed the findings and recommendations with the patient.
If applicable, a reminder letter will be sent to the patient
regarding the next appointment.

BI-RADS CATEGORY  2: Benign.

## 2023-01-11 ENCOUNTER — Emergency Department
Admission: EM | Admit: 2023-01-11 | Discharge: 2023-01-12 | Disposition: A | Discharge status: Home / Self Care | Payer: BC Managed Care – PPO | Attending: Emergency Medicine | Admitting: Emergency Medicine

## 2023-01-11 ENCOUNTER — Other Ambulatory Visit: Payer: Self-pay

## 2023-01-11 ENCOUNTER — Emergency Department: Payer: BC Managed Care – PPO

## 2023-01-11 DIAGNOSIS — E876 Hypokalemia: Secondary | ICD-10-CM | POA: Insufficient documentation

## 2023-01-11 DIAGNOSIS — Z3A01 Less than 8 weeks gestation of pregnancy: Secondary | ICD-10-CM | POA: Diagnosis not present

## 2023-01-11 DIAGNOSIS — O4691 Antepartum hemorrhage, unspecified, first trimester: Secondary | ICD-10-CM | POA: Diagnosis present

## 2023-01-11 DIAGNOSIS — O4690 Antepartum hemorrhage, unspecified, unspecified trimester: Secondary | ICD-10-CM

## 2023-01-11 LAB — URINALYSIS, ROUTINE W REFLEX MICROSCOPIC
Bilirubin Urine: NEGATIVE
Glucose, UA: NEGATIVE mg/dL
Ketones, ur: NEGATIVE mg/dL
Nitrite: NEGATIVE
Protein, ur: NEGATIVE mg/dL
RBC / HPF: >50 RBC/hpf (ref 0–5)
Specific Gravity, Urine: 1.025 (ref 1.005–1.030)
pH: 5.5 (ref 5.0–8.0)

## 2023-01-11 LAB — CBC WITH DIFFERENTIAL/PLATELET
Abs Immature Granulocytes: 0.02 10*3/uL (ref 0.00–0.07)
Basophils Absolute: 0 10*3/uL (ref 0.0–0.1)
Basophils Relative: 1 %
Eosinophils Absolute: 0.2 10*3/uL (ref 0.0–0.5)
Eosinophils Relative: 2 %
HCT: 38 % (ref 36.0–46.0)
Hemoglobin: 12.8 g/dL (ref 12.0–15.0)
Immature Granulocytes: 0 %
Lymphocytes Relative: 39 %
Lymphs Abs: 3.1 10*3/uL (ref 0.7–4.0)
MCH: 31.7 pg (ref 26.0–34.0)
MCHC: 33.7 g/dL (ref 30.0–36.0)
MCV: 94.1 fL (ref 80.0–100.0)
Monocytes Absolute: 0.4 10*3/uL (ref 0.1–1.0)
Monocytes Relative: 5 %
Neutro Abs: 4.1 10*3/uL (ref 1.7–7.7)
Neutrophils Relative %: 53 %
Platelets: 303 10*3/uL (ref 150–400)
RBC: 4.04 MIL/uL (ref 3.87–5.11)
RDW: 12.2 % (ref 11.5–15.5)
WBC: 7.9 10*3/uL (ref 4.0–10.5)
nRBC: 0 % (ref 0.0–0.2)

## 2023-01-11 LAB — BASIC METABOLIC PANEL
Anion gap: 11 (ref 5–15)
BUN: 10 mg/dL (ref 6–20)
CO2: 23 mmol/L (ref 22–32)
Calcium: 9.3 mg/dL (ref 8.9–10.3)
Chloride: 104 mmol/L (ref 98–111)
Creatinine, Ser: 0.73 mg/dL (ref 0.44–1.00)
GFR, Estimated: >60 mL/min (ref >60)
Glucose, Bld: 108 mg/dL — ABNORMAL HIGH (ref 70–99)
Potassium: 3.4 mmol/L — ABNORMAL LOW (ref 3.5–5.1)
Sodium: 138 mmol/L (ref 135–145)

## 2023-01-11 LAB — HCG, QUANTITATIVE, PREGNANCY: hCG, Beta Chain, Quant, S: 38 m[IU]/mL — ABNORMAL HIGH (ref <5)

## 2023-01-11 LAB — POC URINE PREG, ED: Preg Test, Ur: NEGATIVE

## 2023-01-11 NOTE — ED Provider Triage Note (Signed)
Emergency Medicine Provider Triage Evaluation Note  Darlene Escobar G4P2 a 47 y.o. female  was evaluated in triage.  Pt complains of vaginal bleeding, pelvic cramping, and RLQ abd pain.  Review of Systems  Positive: Vag bleeding, pelvic pain Negative: NVD  Physical Exam  BP (!) 136/100   Pulse 80   Temp 98 F (36.7 C) (Oral)   Resp 16   Ht 5\' 3"  (1.6 m)   Wt 108.9 kg   LMP 12/03/2022 (Exact Date)   SpO2 100%   Breastfeeding No   BMI 42.51 kg/m  Gen:   Awake, no distress  NAD Resp:  Normal effort  MSK:   Moves extremities without difficulty  Other:    Medical Decision Making  Medically screening exam initiated at 6:22 PM.  Appropriate orders placed.  Darlene Escobar was informed that the remainder of the evaluation will be completed by another provider, this initial triage assessment does not replace that evaluation, and the importance of remaining in the ED until their evaluation is complete.  Patient to the ED for vaginal bleeding in pregnancy. She is approximately [redacted] weeks GA with her 4th pregnancy.   Lissa Hoard, PA-C 01/11/23 1824

## 2023-01-11 NOTE — ED Triage Notes (Signed)
Pt to ED for vaginal bleeding and RLQ abdominal pain and R back pain since yesterday at [redacted] weeks pregnant. 2 positive home pregnancy tests.  Bleeding is lighter than period. Bright red.  LNMP 12/03/22. No ultrasounds yet.

## 2023-01-12 ENCOUNTER — Other Ambulatory Visit: Payer: BC Managed Care – PPO

## 2023-01-12 ENCOUNTER — Other Ambulatory Visit: Payer: Self-pay

## 2023-01-12 DIAGNOSIS — O4690 Antepartum hemorrhage, unspecified, unspecified trimester: Secondary | ICD-10-CM

## 2023-01-12 NOTE — ED Provider Notes (Signed)
[facility] Provider Note    Event Date/Time   First MD Initiated Contact with Patient 01/11/23 2356     (approximate)   History   Vaginal Bleeding ([redacted]wk pregnant)   HPI Aziana Dall is a 47 y.o. female G4 P2 Ab1 who believes she may be at about [redacted] weeks gestation and presents for evaluation of vaginal bleeding after positive pregnancy test.  She said that she was about 5 days late with her menstrual cycle and was surprised when she took a pregnancy test about a week ago and it was positive.  Then she started having some pain in her right side of abdomen and right back and started having vaginal bleeding which is consistent in terms of flow with a regular menstrual cycle.  The pain got worse while she was out in the waiting room but it is now almost completely gone.  Nothing in particular seems to make it better or worse.  No nausea or vomiting.  No diarrhea.  No recent trauma.  No dysuria.     Physical Exam   Triage Vital Signs: ED Triage Vitals  Enc Vitals Group     BP 01/11/23 1818 (!) 136/100     Pulse Rate 01/11/23 1818 80     Resp 01/11/23 1818 16     Temp 01/11/23 1818 98 F (36.7 C)     Temp Source 01/11/23 1818 Oral     SpO2 01/11/23 1818 100 %     Weight 01/11/23 1819 108.9 kg (240 lb)     Height 01/11/23 1819 1.6 m (5\' 3" )     Head Circumference --      Peak Flow --      Pain Score 01/11/23 1818 0     Pain Loc --      Pain Edu? --      Excl. in GC? --     Most recent vital signs: Vitals:   01/11/23 1818 01/12/23 0120  BP: (!) 136/100 125/86  Pulse: 80 80  Resp: 16 17  Temp: 98 F (36.7 C)   SpO2: 100% 99%    General: Awake, no distress.  Well-appearing. CV:  Good peripheral perfusion.  Regular rate and rhythm. Resp:  Normal effort. Speaking easily and comfortably, no accessory muscle usage nor intercostal retractions.   Abd:  No distention.  Obese.  Soft abdomen, no tenderness to palpation on careful examination throughout  the abdomen.  Negative Murphy sign.  No right lower quadrant tenderness.  No rebound and no guarding.   ED Results / Procedures / Treatments   Labs (all labs ordered are listed, but only abnormal results are displayed) Labs Reviewed  URINALYSIS, ROUTINE W REFLEX MICROSCOPIC - Abnormal; Notable for the following components:      Result Value   APPearance CLEAR (*)    Hgb urine dipstick LARGE (*)    Leukocytes,Ua SMALL (*)    Bacteria, UA RARE (*)    All other components within normal limits  BASIC METABOLIC PANEL - Abnormal; Notable for the following components:   Potassium 3.4 (*)    Glucose, Bld 108 (*)    All other components within normal limits  HCG, QUANTITATIVE, PREGNANCY - Abnormal; Notable for the following components:   hCG, Beta Chain, Quant, S 38 (*)    All other components within normal limits  CBC WITH DIFFERENTIAL/PLATELET  POC URINE PREG, ED      RADIOLOGY I viewed and interpreted the patient's ultrasound and I can  see no evidence of an intrauterine pregnancy.  Radiology commented on no acute abnormalities and no evidence of intrauterine nor ectopic pregnancy.   PROCEDURES:  Critical Care performed: No  Procedures    IMPRESSION / MDM / ASSESSMENT AND PLAN / ED COURSE  I reviewed the triage vital signs and the nursing notes.                              Differential diagnosis includes, but is not limited to, threatened miscarriage, incomplete miscarriage, normal bleeding from an early trimester pregnancy, ectopic pregnancy, , blighted ovum, vaginal/cervical trauma, subchorionic hemorrhage/hematoma, etc.   Patient's presentation is most consistent with acute presentation with potential threat to life or bodily function.  Labs/studies ordered: Quantitative hCG, urinalysis, BMP, CBC with differential, OB ultrasound.  Interventions/Medications given:  Medications - No data to display  (Note:  hospital course my include additional interventions and/or  labs/studies not listed above.)   Patient's vital signs are stable other than some hypertension which is likely chronic and situational.  Patient has no tenderness to palpation of the abdomen.  I wonder if it is possible that she had a small kidney stone given her report of pain and the hematuria in her urine, although the hematuria most likely is due to the vaginal bleeding.  Her labs are otherwise normal.  Of note, her urine pregnancy test was negative but the beta-hCG was slightly positive at 38.  Given the negative OB ultrasound as documented above, I strongly suspect this was a very early pregnancy that will not develop into an intrauterine pregnancy (an inevitable abortion).  I had my usual discussion with the patient and she understands and agrees.  I explained the need for follow-up for repeat hCG.  She understands and agrees with the plan.  I considered advanced imaging such as CT scan to determine if the patient may have another emergent cause of right-sided abdominal pain, but given the very reassuring physical exam, I very much doubt appendicitis and biliary colic.  I talked with the patient about this as well and she agrees that no CT scan is necessary at this time but she knows to return if her symptoms get worse.     Clinical Course as of 01/12/23 0819  Mon Jan 11, 2023  2351 Verified on multiple prior lab tests available within [facility] that patient's blood type is A+. [CF]    Clinical Course User Index [CF] Loleta Rose, MD     FINAL CLINICAL IMPRESSION(S) / ED DIAGNOSES   Final diagnoses:  Vaginal bleeding in pregnancy     Rx / DC Orders   ED Discharge Orders     None        Note:  This document was prepared using Dragon voice recognition software and may include unintentional dictation errors.   Loleta Rose, MD 01/12/23 [phone]

## 2023-01-12 NOTE — Discharge Instructions (Signed)
You have been seen in the Emergency Department (ED) for vaginal bleeding during pregnancy, which is called a "threatened miscarriage" or "threatened abortion".  However, the blood work and ultrasound cannot say for sure that this pregnancy is going to develop into a normal fetus.  It may be too early to know for sure, or it may be that the pregnancy is not going to develop.  You need to follow-up as an outpatient for a repeat blood test called a beta hCG and a repeat ultrasound in order to determine if this is a pregnancy that will never develop, a miscarriage, or a fetus that will develop normally.  As a result of your blood type, you did not receive an injection of medication called Rhogam - please let your OB/Gyn know.  Please follow up as recommended above.  If you develop any other symptoms that concern you (including, but not limited to, persistent vomiting, worsening bleeding, abdominal or pelvic pain, or fever greater than 101), please return immediately to the Emergency Department.  

## 2023-01-13 ENCOUNTER — Other Ambulatory Visit: Payer: BC Managed Care – PPO

## 2023-01-13 DIAGNOSIS — O4690 Antepartum hemorrhage, unspecified, unspecified trimester: Secondary | ICD-10-CM

## 2023-01-14 ENCOUNTER — Other Ambulatory Visit: Payer: BC Managed Care – PPO

## 2023-01-14 ENCOUNTER — Encounter: Payer: Self-pay | Admitting: Licensed Practical Nurse

## 2023-01-14 ENCOUNTER — Ambulatory Visit (INDEPENDENT_AMBULATORY_CARE_PROVIDER_SITE_OTHER): Payer: BC Managed Care – PPO | Admitting: Licensed Practical Nurse

## 2023-01-14 ENCOUNTER — Telehealth: Payer: Self-pay

## 2023-01-14 ENCOUNTER — Other Ambulatory Visit: Payer: Self-pay

## 2023-01-14 VITALS — BP 133/94 | HR 75 | Wt 234.8 lb

## 2023-01-14 DIAGNOSIS — R030 Elevated blood-pressure reading, without diagnosis of hypertension: Secondary | ICD-10-CM | POA: Diagnosis not present

## 2023-01-14 DIAGNOSIS — O4690 Antepartum hemorrhage, unspecified, unspecified trimester: Secondary | ICD-10-CM

## 2023-01-14 DIAGNOSIS — O039 Complete or unspecified spontaneous abortion without complication: Secondary | ICD-10-CM | POA: Diagnosis not present

## 2023-01-14 DIAGNOSIS — R1011 Right upper quadrant pain: Secondary | ICD-10-CM | POA: Diagnosis not present

## 2023-01-14 LAB — HEMOGLOBIN A1C
Est. average glucose Bld gHb Est-mCnc: 111 mg/dL
Hgb A1c MFr Bld: 5.5 % (ref 4.8–5.6)

## 2023-01-14 NOTE — Telephone Encounter (Signed)
I spoke with the patient, apologized for her experience, let her know that [place] Research scientist (physical sciences)) stated that there would be no charge for either lab, and that I was working her in with Carie Caddy, CNM after her lab appointment. The patient thanked me, said that she appreciated this. I apologized again and let the patient know that I would be here and would be happy to talk in person after her appointment, if she wanted.

## 2023-01-14 NOTE — Progress Notes (Signed)
SUBJECTIVE: Here for ED follow up, She found out she was pregnant about 1 week ago, she had some bleeding on Sunday and Monday. She was seen in the ED on Monday evening. Her beta was 38 and an US showed no evidence of a pregnancy and was recommended follow up with her OB.   [id number] This was a surprise pregnancy. She did have a miscarriage 3 years ago. Kandie does not desire any future pregnancies. She does not want to go through another miscarriage. She is most interested in a tubal ligation.   Today her bleeding is light. She does have RUQ pain x 1 week, it is constant, feels like an ache, is worse with position pain, she does notice it gets worse after eating. Denies nausea, but has some last week. She has been able to sleep, may have woke up and noticed pain but fell back asleep. She does have her gall bladder. Her PE was benign in the ED, a CT scan was offered but the pt declined.   Denies hx of CHTN, reports she under a lot of stress-her Father jsut now has been rushed to  the hospital.   OBJECTIVE: BP (!) 133/94   Pulse 75   Wt 234 lb 12.8 oz (106.5 kg)   LMP 12/03/2022 (Exact Date)   BMI 41.59 kg/m   Gen: NAD Abdomen: tender only with deep palpation in RUQ, no masses palpated, lived not enlarged. Not distended  ASSESSMENT: Probable miscarriage RUQ pain, could be liver or gall bladder related  PLAN: -Reviewed this is not a viable pregnancy, most likely a SAB, however if beta increase there could be concern for a pregnancy of unknown location.  Repeat beta, will call pt with results and to discuss next steps -LFT's, amylase and lipase collected, RUQ [place] ordered, consider consult to GI or general surgeon -BP elevated, pt will need follow up  -schedule appointment with Dr Valentino Saxon or Logan Bores to discuss tubal ligation  Carie Caddy, CNM  [facility]  01/14/23  5:00 PM

## 2023-01-14 NOTE — Telephone Encounter (Signed)
Patient contacted office crying upset wanting to know why we had ordered wrong lab for her? Patient states she was seen at [facility] ED on Monday and was told to come to our office for testing. Patient states that nurse she spoke with on phone told her she did not need a office visit and would place order for labs to be drawn. Patient stated on phone " how could you order the wrong test? Do yall not care if I lose my baby?, your office is unprofessional and I cannot believe that this happened. I dont understand why I was told I did not need an appointment to see a doctor for a miscarriage, I have other children to think of and I can't even do anything because Im in pain and bleeding and now I dont know if I lost the baby and your office dosent seem like they care." When I reviewed in chart I see no documented telephone encounter of this call with patient and nurse, I do see that on 01/12/23 a order had been placed by [facility] for a Hemoglobin A1C with a diagnoses of vaginal bleeding. I spoke with Andrey Campanile in our lab to see if a Beta HCG could be added to lab drawn and she stated that it could not and patient would have to come back to have lab redrawn. I advised patient that I will be placing a new order today for her to have lab drawn, patient would like a call back from our office manager before the end of business day to discuss this issue. KW

## 2023-01-15 ENCOUNTER — Ambulatory Visit
Admission: EM | Admit: 2023-01-15 | Discharge: 2023-01-15 | Disposition: A | Discharge status: Home / Self Care | Payer: BC Managed Care – PPO | Attending: Family Medicine | Admitting: Family Medicine

## 2023-01-15 ENCOUNTER — Other Ambulatory Visit: Payer: Self-pay

## 2023-01-15 ENCOUNTER — Encounter: Payer: Self-pay | Admitting: Emergency Medicine

## 2023-01-15 ENCOUNTER — Encounter (HOSPITAL_BASED_OUTPATIENT_CLINIC_OR_DEPARTMENT_OTHER): Payer: Self-pay | Admitting: Emergency Medicine

## 2023-01-15 ENCOUNTER — Emergency Department (HOSPITAL_BASED_OUTPATIENT_CLINIC_OR_DEPARTMENT_OTHER)
Admission: EM | Admit: 2023-01-15 | Discharge: 2023-01-16 | Disposition: A | Discharge status: Home / Self Care | Payer: BC Managed Care – PPO | Attending: Emergency Medicine | Admitting: Emergency Medicine

## 2023-01-15 DIAGNOSIS — R079 Chest pain, unspecified: Secondary | ICD-10-CM

## 2023-01-15 DIAGNOSIS — E876 Hypokalemia: Secondary | ICD-10-CM | POA: Insufficient documentation

## 2023-01-15 DIAGNOSIS — R1011 Right upper quadrant pain: Secondary | ICD-10-CM

## 2023-01-15 DIAGNOSIS — J45909 Unspecified asthma, uncomplicated: Secondary | ICD-10-CM | POA: Diagnosis not present

## 2023-01-15 DIAGNOSIS — R0789 Other chest pain: Secondary | ICD-10-CM | POA: Diagnosis not present

## 2023-01-15 LAB — URINALYSIS, ROUTINE W REFLEX MICROSCOPIC
Bacteria, UA: NONE SEEN
Bilirubin Urine: NEGATIVE
Glucose, UA: NEGATIVE mg/dL
Ketones, ur: NEGATIVE mg/dL
Nitrite: NEGATIVE
Protein, ur: NEGATIVE mg/dL
Specific Gravity, Urine: 1.013 (ref 1.005–1.030)
pH: 6.5 (ref 5.0–8.0)

## 2023-01-15 LAB — COMPREHENSIVE METABOLIC PANEL
ALT: 19 U/L (ref 0–44)
AST: 18 U/L (ref 15–41)
Albumin: 4.9 g/dL (ref 3.5–5.0)
Alkaline Phosphatase: 52 U/L (ref 38–126)
Anion gap: 10 (ref 5–15)
BUN: 11 mg/dL (ref 6–20)
CO2: 24 mmol/L (ref 22–32)
Calcium: 9.3 mg/dL (ref 8.9–10.3)
Chloride: 104 mmol/L (ref 98–111)
Creatinine, Ser: 0.7 mg/dL (ref 0.44–1.00)
GFR, Estimated: >60 mL/min (ref >60)
Glucose, Bld: 85 mg/dL (ref 70–99)
Potassium: 3.4 mmol/L — ABNORMAL LOW (ref 3.5–5.1)
Sodium: 138 mmol/L (ref 135–145)
Total Bilirubin: 0.4 mg/dL (ref 0.3–1.2)
Total Protein: 7.5 g/dL (ref 6.5–8.1)

## 2023-01-15 LAB — CBC
HCT: 38.5 % (ref 36.0–46.0)
Hemoglobin: 13.3 g/dL (ref 12.0–15.0)
MCH: 31.8 pg (ref 26.0–34.0)
MCHC: 34.5 g/dL (ref 30.0–36.0)
MCV: 92.1 fL (ref 80.0–100.0)
Platelets: 291 10*3/uL (ref 150–400)
RBC: 4.18 MIL/uL (ref 3.87–5.11)
RDW: 12.2 % (ref 11.5–15.5)
WBC: 6.7 10*3/uL (ref 4.0–10.5)
nRBC: 0 % (ref 0.0–0.2)

## 2023-01-15 LAB — BETA HCG QUANT (REF LAB): hCG Quant: 3 m[IU]/mL

## 2023-01-15 LAB — LIPASE, BLOOD: Lipase: 17 U/L (ref 11–51)

## 2023-01-15 NOTE — ED Notes (Signed)
Patient is being discharged from the Urgent Care and sent to the [facility] Emergency Department via private vehicle . Per Dr. Rachael Darby, patient is in need of higher level of care due to RUQ abdominal pain and chest pain. Patient is aware and verbalizes understanding of plan of care.  Vitals:   01/15/23 1917  BP: (!) 140/95  Pulse: 81  Resp: 15  Temp: 98.6 F (37 C)  SpO2: 100%

## 2023-01-15 NOTE — ED Provider Notes (Signed)
[place] EMERGENCY DEPARTMENT AT [facility] Provider Note  CSN: [id number] Arrival date & time: 01/15/23 2100  Chief Complaint(s) Abdominal Pain  HPI Darlene Escobar is a 47 y.o. female {Add pertinent medical, surgical, social history, OB history to HPI:1}    Abdominal Pain   Past Medical History Past Medical History:  Diagnosis Date   Anemia    in her 26's   Anxiety    GERD (gastroesophageal reflux disease)    with pregnancy   Mild asthma    well controlled   Patient Active Problem List   Diagnosis Date Noted   Bimalleolar ankle fracture, right, closed, initial encounter 07/15/2018   Vitamin D deficiency 05/06/2017   Obesity (BMI 30-39.9) 12/31/2015   ASCUS favor benign 08/21/2015   Home Medication(s) Prior to Admission medications   Medication Sig Start Date End Date Taking? Authorizing Provider  albuterol (VENTOLIN HFA) 108 (90 Base) MCG/ACT inhaler INHALE 1-2 PUFFS INTO THE LUNGS EVERY 6 (SIX) HOURS AS NEEDED FOR WHEEZING OR SHORTNESS OF BREATH. 12/30/20   Lawhorn, Vanessa [place], CNM  cholecalciferol (VITAMIN D3) 25 MCG (1000 UNIT) tablet Take 2,000 Units by mouth 2 (two) times daily.    [provider]  nystatin-triamcinolone (MYCOLOG II) cream Apply 1 application topically 2 (two) times daily. Patient not taking: Reported on 01/14/2023 05/03/20   Gunnar Bulla, CNM  Prenatal Vit-Fe Fumarate-FA (PRENATAL VITAMINS) 28-0.8 MG TABS Take 28 mg by mouth daily. 01/12/20   Lawhorn, Vanessa [place], CNM  sertraline (ZOLOFT) 25 MG tablet TAKE 1 TABLET (25 MG TOTAL) BY MOUTH DAILY. Patient not taking: Reported on 01/14/2023 03/18/21   Gunnar Bulla, CNM                                                                                                                                    Allergies Patient has no known allergies.  Review of Systems Review of Systems  Gastrointestinal:  Positive for abdominal pain.   As noted in  HPI  Physical Exam Vital Signs  I have reviewed the triage vital signs BP 135/87 (BP Location: Right Arm)   Pulse 74   Temp 97.7 F (36.5 C) (Oral)   Resp 17   LMP 12/03/2022   SpO2 100%  *** Physical Exam  ED Results and Treatments Labs (all labs ordered are listed, but only abnormal results are displayed) Labs Reviewed  COMPREHENSIVE METABOLIC PANEL - Abnormal; Notable for the following components:      Result Value   Potassium 3.4 (*)    All other components within normal limits  URINALYSIS, ROUTINE W REFLEX MICROSCOPIC - Abnormal; Notable for the following components:   Hgb urine dipstick MODERATE (*)    Leukocytes,Ua TRACE (*)    All other components within normal limits  LIPASE, BLOOD  CBC  EKG  EKG Interpretation  Date/Time:    Ventricular Rate:    PR Interval:    QRS Duration:   QT Interval:    QTC Calculation:   R Axis:     Text Interpretation:         Radiology No results found.  Medications Ordered in ED Medications - No data to display                                                                                                                                   Procedures Procedures  (including critical care time)  Medical Decision Making / ED Course  Click here for ABCD2, HEART and other calculators  Medical Decision Making Amount and/or Complexity of Data Reviewed Labs: ordered.     ***      Final Clinical Impression(s) / ED Diagnoses Final diagnoses:  None    {Document critical care time when appropriate:1}  {Document review of labs and clinical decision tools ie heart score, Chads2Vasc2 etc:1}  {Document your independent review of radiology images, and any outside records:1} {Document your discussion with family members, caretakers, and with consultants:1} {Document social determinants of health  affecting pt's care:1} {Document your decision making why or why not admission, treatments were needed:1} This chart was dictated using voice recognition software.  Despite best efforts to proofread,  errors can occur which can change the documentation meaning.

## 2023-01-15 NOTE — Discharge Instructions (Addendum)

## 2023-01-15 NOTE — ED Provider Notes (Addendum)
[facility]    CSN: [id number] Arrival date & time: 01/15/23  1908      History   Chief Complaint Chief Complaint  Patient presents with   Abdominal Pain   Chest Pain    HPI Darlene Escobar is a 47 y.o. female.   HPI  Darlene Escobar presents for upper abdominal pain that wraps around to her chest and back for the past week.  Reports pain worse with eating. Has seen PCP for same who ordered ultrasound for later this month but the pain got worse so she came to the urgent care She is is undergoing a miscarriage and has been under a lot of stress.  Taken Motrin for back pain.   Symptoms Nausea/Vomiting: yes  Diarrhea: loose, non-bloody  Constipation: no  Melena/BRBPR: no  Hematemesis: no  Anorexia: yes  Fever/Chills: no  Dysuria: no  Rash: no  Wt loss: no  EtOH use: no  NSAIDs/ASA: yes  LMP: Patient's last menstrual period was 12/03/2022. Vaginal bleeding: yes  Sore throat: no   Cough: no Nasal congestion : no  Sleep disturbance: no Back Pain: yes Headache: yes  Past Medical History:  Diagnosis Date   Anemia    in her 20's   Anxiety    GERD (gastroesophageal reflux disease)    with pregnancy   Mild asthma    well controlled    Patient Active Problem List   Diagnosis Date Noted   Bimalleolar ankle fracture, right, closed, initial encounter 07/15/2018   Vitamin D deficiency 05/06/2017   Obesity (BMI 30-39.9) 12/31/2015   ASCUS favor benign 08/21/2015    Past Surgical History:  Procedure Laterality Date   ORIF ANKLE FRACTURE Right 07/15/2018   Procedure: OPEN REDUCTION INTERNAL FIXATION (ORIF) ANKLE FRACTURE;  Surgeon: Juanell Fairly, MD;  Location: [facility] ORS;  Service: Orthopedics;  Laterality: Right;   TONSILLECTOMY     age 54     OB History     Gravida  4   Para  2   Term  2   Preterm      AB      Living  2      SAB      IAB      Ectopic      Multiple  0   Live Births  2            Home Medications    Prior  to Admission medications   Medication Sig Start Date End Date Taking? Authorizing Provider  albuterol (VENTOLIN HFA) 108 (90 Base) MCG/ACT inhaler INHALE 1-2 PUFFS INTO THE LUNGS EVERY 6 (SIX) HOURS AS NEEDED FOR WHEEZING OR SHORTNESS OF BREATH. 12/30/20   Lawhorn, Vanessa [place], CNM  cholecalciferol (VITAMIN D3) 25 MCG (1000 UNIT) tablet Take 2,000 Units by mouth 2 (two) times daily.    [provider]  lidocaine (XYLOCAINE) 2 % solution Use as directed 10 mLs in the mouth or throat every 6 (six) hours as needed (stomach pain). 01/16/23   Cardama, Amadeo Garnet, MD  nystatin-triamcinolone (MYCOLOG II) cream Apply 1 application topically 2 (two) times daily. Patient not taking: Reported on 01/14/2023 05/03/20   Gunnar Bulla, CNM  Prenatal Vit-Fe Fumarate-FA (PRENATAL VITAMINS) 28-0.8 MG TABS Take 28 mg by mouth daily. 01/12/20   Lawhorn, Vanessa [place], CNM  sertraline (ZOLOFT) 25 MG tablet TAKE 1 TABLET (25 MG TOTAL) BY MOUTH DAILY. Patient not taking: Reported on 01/14/2023 03/18/21   Gunnar Bulla, CNM  sucralfate (CARAFATE) 1 GM/10ML  suspension Take 10 mLs (1 g total) by mouth 4 (four) times daily -  with meals and at bedtime. 01/16/23   CardamaAmadeo Garnet, MD    Family History Family History  Problem Relation Age of Onset   Diabetes Mother    Kidney cancer Mother    Heart disease Father    Cancer Neg Hx    Breast cancer Neg Hx     Social History Social History   Tobacco Use   Smoking status: Former    Packs/day: 1.00    Years: 10.00    Additional pack years: 0.00    Total pack years: 10.00    Types: Cigarettes    Quit date: 07/13/2005    Years since quitting: 17.5   Smokeless tobacco: Never  Vaping Use   Vaping Use: Never used  Substance Use Topics   Alcohol use: Not Currently    Comment: occ wine   Drug use: No     Allergies   Patient has no known allergies.   Review of Systems Review of Systems :negative unless otherwise stated  in HPI.      Physical Exam Triage Vital Signs ED Triage Vitals  Enc Vitals Group     BP 01/15/23 1917 (!) 140/95     Pulse Rate 01/15/23 1917 81     Resp 01/15/23 1917 15     Temp 01/15/23 1917 98.6 F (37 C)     Temp Source 01/15/23 1917 Oral     SpO2 01/15/23 1917 100 %     Weight 01/15/23 1916 234 lb 12.6 oz (106.5 kg)     Height 01/15/23 1916 5\' 3"  (1.6 m)     Head Circumference --      Peak Flow --      Pain Score 01/15/23 1916 4     Pain Loc --      Pain Edu? --      Excl. in GC? --    No data found.  Updated Vital Signs BP (!) 140/95 (BP Location: Left Arm)   Pulse 81   Temp 98.6 F (37 C) (Oral)   Resp 15   Ht 5\' 3"  (1.6 m)   Wt 106.5 kg   LMP 12/03/2022   SpO2 100%   Breastfeeding No   BMI 41.59 kg/m   Visual Acuity Right Eye Distance:   Left Eye Distance:   Bilateral Distance:    Right Eye Near:   Left Eye Near:    Bilateral Near:     Physical Exam  GEN: non-toxic appearing female, in no acute distress  CV: regular rate and rhythm, no murmurs appreciated  RESP: no increased work of breathing, clear to ascultation bilaterally ABD: Bowel sounds present. Soft, epigastric and RUQ tenderness, non-distended.  No guarding, no rebound, no appreciable hepatosplenomegaly, no CVA tenderness, negative McBurney's, equivocal Murphy MSK: no extremity edema SKIN: warm, dry, no rash on visible skin NEURO: alert, moves all extremities appropriately PSYCH: Normal affect, appropriate speech and behavior   UC Treatments / Results  Labs (all labs ordered are listed, but only abnormal results are displayed) Labs Reviewed - No data to display  EKG  If EKG performed, see my interpretation and MDM section  Radiology No results found.   Procedures Procedures (including critical care time)  Medications Ordered in UC Medications - No data to display  Initial Impression / Assessment and Plan / UC Course  I have reviewed the triage vital signs and the  nursing notes.  Pertinent labs & imaging results that were available during my care of the patient were reviewed by me and considered in my medical decision making (see chart for details).       Patient is a  47 y.o. femalewith history recent miscarriage who presents after having insidious severe abdominal pain, chest and back for the past week. EKG personally interpreted by me; NSR without acute ST or T wave changes which is similar to Jan 2023 EKG.   Overall, patient is non-toxic-appearing, well-hydrated, and in no acute distress.  She is mildly hypertensive but otherwise VSS.  Selenais afebrile.  Exam is not concerning for an peritonitis but has epigastric and right upper quadrant tenderness to palpation.  Given her history of recent miscarriage and abdominal pain recommended that she be evaluated in the emergency department and patient is agreeable.  She will travel to [facility] emergency department via private vehicle and is stable to do so.  I suspect she will likely need abdominal imaging to rule out acute abdominal pathology.   Discussed MDM, treatment plan and plan for follow-up with patient who agrees with plan.    Final Clinical Impressions(s) / UC Diagnoses   Final diagnoses:  Right upper quadrant abdominal pain  Atypical chest pain     Discharge Instructions      You have been advised to follow up immediately in the emergency department for concerning signs or symptoms as discussed during your visit. If you declined EMS transport, please have a family member take you directly to the ED at this time. Do not delay.   Based on concerns about condition, if you do not follow up in the ED, you may risk poor outcomes including worsening of condition, delayed treatment and potentially life threatening issues. If you have declined to go to the ED at this time, you should call your PCP immediately to set up a follow up appointment.   Go to ED for red flag symptoms, including; fevers  you cannot reduce with Tylenol/Motrin, severe headaches, vision changes, numbness/weakness in part of the body, lethargy, confusion, intractable vomiting, severe dehydration, chest pain, breathing difficulty, severe persistent abdominal or pelvic pain, signs of severe infection (increased redness, swelling of an area), feeling faint or passing out, dizziness, etc. You should especially go to the ED for sudden acute worsening of condition if you do not elect to go at this time.       ED Prescriptions   None    PDMP not reviewed this encounter.      Katha Cabal, DO 01/30/23 0017

## 2023-01-15 NOTE — ED Triage Notes (Addendum)
Pt presents to ED POV. PT c/o RUQ abd pain x1w. Pt reports that she found out that she was pregnant 2w ago and had miscarriage. That was confirmed by hcg. But pain has persisted even though bleeding has stopped. Pt reports having diarrhea all week

## 2023-01-15 NOTE — ED Triage Notes (Signed)
Patient c/o abdominal pain a week ago.  Patient states that she found out she was pregnant [redacted] weeks ago.  Patient states that on Sunday she started having vaginal bleeding and went to the ED.  Patient was told that there was no fetus in her uterus.  Patient is currently emotional and crying during triage.  Patient reports chest pain that started about 20 min ago.  Patient states that she feels like she can't take a deep breath.  Patient states that she has been under a lot of stress.  Patient states that her father is at [facility] for a MI.

## 2023-01-16 ENCOUNTER — Emergency Department (HOSPITAL_BASED_OUTPATIENT_CLINIC_OR_DEPARTMENT_OTHER): Payer: BC Managed Care – PPO

## 2023-01-16 LAB — HEPATIC FUNCTION PANEL
ALT: 21 IU/L (ref 0–32)
AST: 21 IU/L (ref 0–40)
Albumin: 4.6 g/dL (ref 3.9–4.9)
Alkaline Phosphatase: 65 IU/L (ref 44–121)
Bilirubin Total: <0.2 mg/dL (ref 0.0–1.2)
Bilirubin, Direct: <0.1 mg/dL (ref 0.00–0.40)
Total Protein: 7.1 g/dL (ref 6.0–8.5)

## 2023-01-16 LAB — LIPASE: Lipase: 28 U/L (ref 14–72)

## 2023-01-16 LAB — SPECIMEN STATUS REPORT

## 2023-01-16 LAB — AMYLASE: Amylase: 33 U/L (ref 31–110)

## 2023-01-16 MED ORDER — ALUM & MAG HYDROXIDE-SIMETH 200-200-20 MG/5ML PO SUSP
30.0000 mL | Freq: Once | ORAL | Status: AC
  Administered 2023-01-16: 30 mL via ORAL
  Filled 2023-01-16: qty 30

## 2023-01-16 MED ORDER — SUCRALFATE 1 GM/10ML PO SUSP: 1.0000 g | Freq: Three times a day (TID) | ORAL | 0 refills | Status: AC

## 2023-01-16 MED ORDER — LIDOCAINE VISCOUS HCL 2 % MT SOLN
15.0000 mL | Freq: Once | OROMUCOSAL | Status: AC
  Administered 2023-01-16: 15 mL via ORAL
  Filled 2023-01-16: qty 15

## 2023-01-16 MED ORDER — LIDOCAINE VISCOUS HCL 2 % MT SOLN: 10.0000 mL | Freq: Four times a day (QID) | OROMUCOSAL | 0 refills | Status: DC | PRN

## 2023-01-16 NOTE — ED Notes (Signed)
Discharge instructions discussed with pt. Pt verbalized understanding. Pt stable and ambulatory.  °

## 2023-01-18 ENCOUNTER — Ambulatory Visit: Admission: RE | Admit: 2023-01-18 | Payer: BC Managed Care – PPO | Source: Ambulatory Visit

## 2023-01-18 ENCOUNTER — Encounter: Payer: Self-pay | Admitting: Licensed Practical Nurse

## 2023-01-19 ENCOUNTER — Other Ambulatory Visit: Payer: Self-pay

## 2023-01-19 DIAGNOSIS — Z8711 Personal history of peptic ulcer disease: Secondary | ICD-10-CM

## 2023-01-22 ENCOUNTER — Other Ambulatory Visit: Payer: BC Managed Care – PPO

## 2023-05-24 ENCOUNTER — Emergency Department (HOSPITAL_BASED_OUTPATIENT_CLINIC_OR_DEPARTMENT_OTHER): Payer: BC Managed Care – PPO | Admitting: Radiology

## 2023-05-24 ENCOUNTER — Other Ambulatory Visit: Payer: Self-pay

## 2023-05-24 ENCOUNTER — Encounter (HOSPITAL_BASED_OUTPATIENT_CLINIC_OR_DEPARTMENT_OTHER): Payer: Self-pay | Admitting: Pediatrics

## 2023-05-24 ENCOUNTER — Emergency Department (HOSPITAL_BASED_OUTPATIENT_CLINIC_OR_DEPARTMENT_OTHER)
Admission: EM | Admit: 2023-05-24 | Discharge: 2023-05-24 | Disposition: A | Discharge status: Home / Self Care | Payer: BC Managed Care – PPO | Attending: Emergency Medicine | Admitting: Emergency Medicine

## 2023-05-24 DIAGNOSIS — R0602 Shortness of breath: Secondary | ICD-10-CM | POA: Diagnosis present

## 2023-05-24 DIAGNOSIS — J45909 Unspecified asthma, uncomplicated: Secondary | ICD-10-CM | POA: Diagnosis not present

## 2023-05-24 DIAGNOSIS — Z87891 Personal history of nicotine dependence: Secondary | ICD-10-CM | POA: Diagnosis not present

## 2023-05-24 DIAGNOSIS — M546 Pain in thoracic spine: Secondary | ICD-10-CM | POA: Diagnosis not present

## 2023-05-24 DIAGNOSIS — Z7951 Long term (current) use of inhaled steroids: Secondary | ICD-10-CM | POA: Diagnosis not present

## 2023-05-24 DIAGNOSIS — R5383 Other fatigue: Secondary | ICD-10-CM | POA: Diagnosis not present

## 2023-05-24 DIAGNOSIS — R002 Palpitations: Secondary | ICD-10-CM | POA: Insufficient documentation

## 2023-05-24 DIAGNOSIS — R0789 Other chest pain: Secondary | ICD-10-CM | POA: Insufficient documentation

## 2023-05-24 LAB — CBC
HCT: 35.4 % — ABNORMAL LOW (ref 36.0–46.0)
Hemoglobin: 12.5 g/dL (ref 12.0–15.0)
MCH: 32.1 pg (ref 26.0–34.0)
MCHC: 35.3 g/dL (ref 30.0–36.0)
MCV: 90.8 fL (ref 80.0–100.0)
Platelets: 277 10*3/uL (ref 150–400)
RBC: 3.9 MIL/uL (ref 3.87–5.11)
RDW: 12.2 % (ref 11.5–15.5)
WBC: 5.9 10*3/uL (ref 4.0–10.5)
nRBC: 0 % (ref 0.0–0.2)

## 2023-05-24 LAB — PREGNANCY, URINE: Preg Test, Ur: NEGATIVE

## 2023-05-24 LAB — HEPATIC FUNCTION PANEL
ALT: 17 U/L (ref 0–44)
AST: 16 U/L (ref 15–41)
Albumin: 4.2 g/dL (ref 3.5–5.0)
Alkaline Phosphatase: 61 U/L (ref 38–126)
Bilirubin, Direct: 0.1 mg/dL (ref 0.0–0.2)
Indirect Bilirubin: 0.3 mg/dL (ref 0.3–0.9)
Total Bilirubin: 0.4 mg/dL (ref 0.3–1.2)
Total Protein: 7 g/dL (ref 6.5–8.1)

## 2023-05-24 LAB — TROPONIN I (HIGH SENSITIVITY)
Troponin I (High Sensitivity): <2 ng/L (ref <18)
Troponin I (High Sensitivity): <2 ng/L (ref <18)

## 2023-05-24 LAB — BASIC METABOLIC PANEL
Anion gap: 7 (ref 5–15)
BUN: 9 mg/dL (ref 6–20)
CO2: 25 mmol/L (ref 22–32)
Calcium: 9.5 mg/dL (ref 8.9–10.3)
Chloride: 106 mmol/L (ref 98–111)
Creatinine, Ser: 0.65 mg/dL (ref 0.44–1.00)
GFR, Estimated: >60 mL/min (ref >60)
Glucose, Bld: 99 mg/dL (ref 70–99)
Potassium: 3.7 mmol/L (ref 3.5–5.1)
Sodium: 138 mmol/L (ref 135–145)

## 2023-05-24 LAB — TSH: TSH: 2.135 u[IU]/mL (ref 0.350–4.500)

## 2023-05-24 LAB — LIPASE, BLOOD: Lipase: 17 U/L (ref 11–51)

## 2023-05-24 LAB — D-DIMER, QUANTITATIVE: D-Dimer, Quant: 0.27 ug/mL-FEU (ref 0.00–0.50)

## 2023-05-24 NOTE — Discharge Instructions (Signed)
Make an appointment to follow-up with cardiology.  Workup here today troponin heart markers both normal.  Screening test for blood clots in the lungs D-dimer was also negative.  Very important to follow-up with cardiology for the palpitations.  Return for any racing heart rate the last 40 minutes or longer.  Thyroid-stimulating hormone is pending.  Would also recommend make an appointment follow-up with your primary care doctor.

## 2023-05-24 NOTE — ED Notes (Signed)
Patient given discharge instructions. Questions were answered. Patient verbalized understanding of discharge instructions and care at home.

## 2023-05-24 NOTE — Progress Notes (Signed)
RT listened to the Pt's lungs and they are clear

## 2023-05-24 NOTE — ED Provider Notes (Addendum)
[facility] AT [facility] Provider Note   CSN: [id number] Arrival date & time: 05/24/23  1010     History  Chief Complaint  Patient presents with   Shortness of Breath   Chest Pain   Fatigue    Darlene Escobar is a 47 y.o. female.  Patient awakened this morning at about 4 AM with intense upper back pain little bit more to the left upper back.  Pain was shooting associated with palpitations and shortness of breath.  That resolved and then had a recurrence of pain between the shoulder blades and a little bit of left anterior chest pain as well also associated with shortness of breath.  Patient relates that she has had a history of palpitations ongoing now for some time.  Usually last about 15 minutes and then resolved.  Also patient states she has not felt well for a while.  She has had some some longer right upper quadrant discomfort.  But that is not any new or worse.  She was recommended to follow-up with gastroenterology before but due to the challenges of taking care of family has not been able to follow-up with them.  Past medical history sniffing for anxiety mild asthma gastroesophageal reflux disease and anemia.  Past surgical history only significant for tonsillectomy.  Patient is a former smoker quit in 2006.  Patient denies any palpitations currently.  Patient denies any leg swelling.       Home Medications Prior to Admission medications   Medication Sig Start Date End Date Taking? Authorizing Provider  albuterol (VENTOLIN HFA) 108 (90 Base) MCG/ACT inhaler INHALE 1-2 PUFFS INTO THE LUNGS EVERY 6 (SIX) HOURS AS NEEDED FOR WHEEZING OR SHORTNESS OF BREATH. 12/30/20   Lawhorn, Vanessa Powderly, CNM  cholecalciferol (VITAMIN D3) 25 MCG (1000 UNIT) tablet Take 2,000 Units by mouth 2 (two) times daily.    [provider]  lidocaine (XYLOCAINE) 2 % solution Use as directed 10 mLs in the mouth or throat every 6 (six) hours as needed (stomach  pain). 01/16/23   Cardama, Amadeo Garnet, MD  nystatin-triamcinolone (MYCOLOG II) cream Apply 1 application topically 2 (two) times daily. Patient not taking: Reported on 01/14/2023 05/03/20   Gunnar Bulla, CNM  Prenatal Vit-Fe Fumarate-FA (PRENATAL VITAMINS) 28-0.8 MG TABS Take 28 mg by mouth daily. 01/12/20   Lawhorn, Vanessa Elmo, CNM  sertraline (ZOLOFT) 25 MG tablet TAKE 1 TABLET (25 MG TOTAL) BY MOUTH DAILY. Patient not taking: Reported on 01/14/2023 03/18/21   Gunnar Bulla, CNM  sucralfate (CARAFATE) 1 GM/10ML suspension Take 10 mLs (1 g total) by mouth 4 (four) times daily -  with meals and at bedtime. 01/16/23   Nira Conn, MD      Allergies    Patient has no known allergies.    Review of Systems   Review of Systems  Respiratory:  Positive for shortness of breath.   Cardiovascular:  Positive for chest pain and palpitations.  Gastrointestinal:  Positive for abdominal pain.  Musculoskeletal:  Positive for back pain.    Physical Exam Updated Vital Signs BP 126/86 (BP Location: Left Arm)   Pulse 81   Temp 97.8 F (36.6 C) (Oral)   Resp 18   Ht 1.6 m (5\' 3" )   Wt 105.2 kg   LMP 05/10/2023 (Approximate)   SpO2 99%   BMI 41.10 kg/m  Physical Exam Vitals and nursing note reviewed.  Constitutional:      General: She is not in acute  distress.    Appearance: She is well-developed.  HENT:     Head: Normocephalic and atraumatic.  Eyes:     Conjunctiva/sclera: Conjunctivae normal.  Cardiovascular:     Rate and Rhythm: Normal rate and regular rhythm.     Heart sounds: No murmur heard. Pulmonary:     Effort: Pulmonary effort is normal. No respiratory distress.     Breath sounds: Normal breath sounds.  Abdominal:     Palpations: Abdomen is soft.     Tenderness: There is no abdominal tenderness.  Musculoskeletal:        General: No swelling.     Cervical back: Normal range of motion and neck supple.  Skin:    General: Skin is warm and  dry.     Capillary Refill: Capillary refill takes less than 2 seconds.  Neurological:     General: No focal deficit present.     Mental Status: She is alert and oriented to person, place, and time.  Psychiatric:        Mood and Affect: Mood normal.     ED Results / Procedures / Treatments   Labs (all labs ordered are listed, but only abnormal results are displayed) Labs Reviewed  CBC - Abnormal; Notable for the following components:      Result Value   HCT 35.4 (*)    All other components within normal limits  BASIC METABOLIC PANEL  PREGNANCY, URINE  D-DIMER, QUANTITATIVE (NOT AT [facility])  TSH  LIPASE, BLOOD  HEPATIC FUNCTION PANEL  TROPONIN I (HIGH SENSITIVITY)    EKG EKG Interpretation Date/Time:  Monday May 24 2023 10:24:51 EDT Ventricular Rate:  85 PR Interval:  148 QRS Duration:  84 QT Interval:  370 QTC Calculation: 440 R Axis:   87  Text Interpretation: Normal sinus rhythm Normal ECG When compared with ECG of 15-Jan-2023 19:23, No significant change was found Confirmed by Vanetta Mulders [phone]) on 05/24/2023 11:22:24 AM  Radiology No results found.  Procedures Procedures    Medications Ordered in ED Medications - No data to display  ED Course/ Medical Decision Making/ A&P                                 Medical Decision Making Amount and/or Complexity of Data Reviewed Labs: ordered. Radiology: ordered.  Patient giving history of palpitations also chest pain back pain and shortness of breath.  Patient's initial troponin was less than 2 but needs delta troponin.  EKG without any acute findings.  Cardiac monitor here without arrhythmia.  Chest x-ray is pending.  However clinically very concerning for possible pulmonary embolus.  But with her history of palpitations in the past also could be thyroid abnormalities with TSH added.  CBC normal basic metabolic panel normal pregnancy test is in process.  Chest x-ray still pending.  If workup is negative  then patient will need to follow-up with cardiology.  Will check delta troponin will get D-dimer.  Because of her history of prolonged right upper quadrant pain although not significantly tender today we will get hepatic and lipase as well.  Patient's labs here are very reassuring.  D-dimer 0.27.  Troponins x 2 less than 2.  Hepatic function also normal lipase normal.  Pregnancy test negative.  Chest x-ray no acute cardiopulmonary disease.  Based on this I think patient stable for discharge home.  Precautions that if palpitations last for more than 40 minutes to get seen  again.  Will give her referral to cardiology.  Final Clinical Impression(s) / ED Diagnoses Final diagnoses:  Shortness of breath  Atypical chest pain  Palpitations    Rx / DC Orders ED Discharge Orders     None         Vanetta Mulders, MD 05/24/23 1141    Vanetta Mulders, MD 05/24/23 1259

## 2023-05-24 NOTE — ED Triage Notes (Signed)
Reported awaken this morning around 4 am with intense upper back pain, c/o shooting pain upward between the shoulder pains. C/O shortness of breath, chest pressure and palpitations. "I can feel my heart pumping in my throat'. C/O ongoing fatigue as well.

## 2023-12-07 ENCOUNTER — Ambulatory Visit (INDEPENDENT_AMBULATORY_CARE_PROVIDER_SITE_OTHER)

## 2023-12-07 ENCOUNTER — Encounter: Payer: Self-pay | Admitting: Emergency Medicine

## 2023-12-07 ENCOUNTER — Ambulatory Visit
Admission: EM | Admit: 2023-12-07 | Discharge: 2023-12-07 | Disposition: A | Discharge status: Home / Self Care | Attending: Emergency Medicine | Admitting: Emergency Medicine

## 2023-12-07 DIAGNOSIS — J4541 Moderate persistent asthma with (acute) exacerbation: Secondary | ICD-10-CM | POA: Diagnosis not present

## 2023-12-07 DIAGNOSIS — J189 Pneumonia, unspecified organism: Secondary | ICD-10-CM

## 2023-12-07 DIAGNOSIS — R051 Acute cough: Secondary | ICD-10-CM

## 2023-12-07 MED ORDER — IPRATROPIUM-ALBUTEROL 0.5-2.5 (3) MG/3ML IN SOLN
3.0000 mL | Freq: Once | RESPIRATORY_TRACT | Status: AC
  Administered 2023-12-07: 3 mL via RESPIRATORY_TRACT

## 2023-12-07 MED ORDER — ALBUTEROL SULFATE HFA 108 (90 BASE) MCG/ACT IN AERS
2.0000 | INHALATION_SPRAY | RESPIRATORY_TRACT | 0 refills | Status: AC | PRN
Start: 2023-12-07 — End: ?

## 2023-12-07 MED ORDER — AEROCHAMBER MV MISC: 1 refills | Status: AC

## 2023-12-07 MED ORDER — PREDNISONE 20 MG PO TABS: 40.0000 mg | ORAL_TABLET | Freq: Every day | ORAL | 0 refills | Status: AC

## 2023-12-07 MED ORDER — DEXAMETHASONE SODIUM PHOSPHATE 10 MG/ML IJ SOLN
10.0000 mg | Freq: Once | INTRAMUSCULAR | Status: AC
  Administered 2023-12-07: 10 mg via INTRAMUSCULAR

## 2023-12-07 MED ORDER — ALBUTEROL SULFATE (2.5 MG/3ML) 0.083% IN NEBU
2.5000 mg | INHALATION_SOLUTION | Freq: Once | RESPIRATORY_TRACT | Status: AC
  Administered 2023-12-07: 2.5 mg via RESPIRATORY_TRACT

## 2023-12-07 MED ORDER — PROMETHAZINE-DM 6.25-15 MG/5ML PO SYRP: 5.0000 mL | ORAL_SOLUTION | Freq: Four times a day (QID) | ORAL | 0 refills | Status: DC | PRN

## 2023-12-07 MED ORDER — AMOXICILLIN-POT CLAVULANATE 875-125 MG PO TABS: 1.0000 | ORAL_TABLET | Freq: Two times a day (BID) | ORAL | 0 refills | Status: AC

## 2023-12-07 NOTE — ED Provider Notes (Signed)
 HPI  SUBJECTIVE:  Darlene Escobar is a 48 y.o. female who presents with 1.5 weeks of postnasal drip, wheezing, worsening shortness of breath.  She states she could not sleep last night because she gets more short of breath when she lies down.  She states that she coughed up "red tissue" this morning, but denies hemoptysis.  She reports chest soreness with coughing, nasal congestion, rhinorrhea, 2 days of sinus pain and pressure, upper dental pain.  She reports 4 days of constant chest tightness/pressure which is new for her.  It is worse with exertion and lying down.  It does not radiate up her neck, down her arm or through to her back.  She had an e-visit 4 days ago, started on Medrol Dosepak, azithromycin Z-Pak and has been using her albuterol every 2 hours.  She is not using her spacer.  She has also tried Tylenol, Mucinex, and her humidifier.  Mucinex was making her better but she discontinued it because it made her cough worse.  Symptoms are worse with lying down, walking.  No unintentional weight gain, lower extremity edema, nocturia, orthopnea, abdominal pain.  No recent surgery, recent immobilization, exogenous estrogen, smoking.  Past medical history of asthma, GERD, anemia, quit smoking in 2006, BMI above 30.  No history of PE, DVT, MI, cancer, CVA, PAD, CAD, hypercholesterolemia, CHF.  Family history negative for MI before age 48.  LMP: 2 weeks ago.  Denies possibility being pregnant.  PC[facility]: [facility]  Past Medical History:  Diagnosis Date   Anemia    in her 58's   Anxiety    GERD (gastroesophageal reflux disease)    with pregnancy   Mild asthma    well controlled    Past Surgical History:  Procedure Laterality Date   ORIF ANKLE FRACTURE Right 07/15/2018   Procedure: OPEN REDUCTION INTERNAL FIXATION (ORIF) ANKLE FRACTURE;  Surgeon: Juanell Fairly, MD;  Location: [facility] ORS;  Service: Orthopedics;  Laterality: Right;   TONSILLECTOMY     age 43     Family History   Problem Relation Age of Onset   Diabetes Mother    Kidney cancer Mother    Heart disease Father    Cancer Neg Hx    Breast cancer Neg Hx     Social History   Tobacco Use   Smoking status: Former    Current packs/day: 0.00    Average packs/day: 1 pack/day for 10.0 years (10.0 ttl pk-yrs)    Types: Cigarettes    Start date: 07/14/1995    Quit date: 07/13/2005    Years since quitting: 18.4   Smokeless tobacco: Never  Vaping Use   Vaping status: Never Used  Substance Use Topics   Alcohol use: Not Currently    Comment: occ wine   Drug use: No    No current facility-administered medications for this encounter.  Current Outpatient Medications:    albuterol (VENTOLIN HFA) 108 (90 Base) MCG/ACT inhaler, Inhale 2 puffs into the lungs every 4 (four) hours as needed for wheezing or shortness of breath., Disp: 1 each, Rfl: 0   amoxicillin-clavulanate (AUGMENTIN) 875-125 MG tablet, Take 1 tablet by mouth every 12 (twelve) hours for 5 days., Disp: 10 tablet, Rfl: 0   predniSONE (DELTASONE) 20 MG tablet, Take 2 tablets (40 mg total) by mouth daily with breakfast for 5 days., Disp: 10 tablet, Rfl: 0   promethazine-dextromethorphan (PROMETHAZINE-DM) 6.25-15 MG/5ML syrup, Take 5 mLs by mouth 4 (four) times daily as needed for cough., Disp: 118  mL, Rfl: 0   Spacer/Aero-Holding Chambers (AEROCHAMBER MV) inhaler, Use as instructed, Disp: 1 each, Rfl: 1   albuterol (VENTOLIN HFA) 108 (90 Base) MCG/ACT inhaler, INHALE 1-2 PUFFS INTO THE LUNGS EVERY 6 (SIX) HOURS AS NEEDED FOR WHEEZING OR SHORTNESS OF BREATH., Disp: 6.7 each, Rfl: 1   azithromycin (ZITHROMAX) 250 MG tablet, Take by mouth., Disp: , Rfl:    cholecalciferol (VITAMIN D3) 25 MCG (1000 UNIT) tablet, Take 2,000 Units by mouth 2 (two) times daily., Disp: , Rfl:    Prenatal Vit-Fe Fumarate-FA (PRENATAL VITAMINS) 28-0.8 MG TABS, Take 28 mg by mouth daily., Disp: 30 tablet, Rfl: 11   sertraline (ZOLOFT) 25 MG tablet, TAKE 1 TABLET (25 MG TOTAL)  BY MOUTH DAILY. (Patient not taking: Reported on 01/14/2023), Disp: 90 tablet, Rfl: 1   sucralfate (CARAFATE) 1 GM/10ML suspension, Take 10 mLs (1 g total) by mouth 4 (four) times daily -  with meals and at bedtime., Disp: 420 mL, Rfl: 0  No Known Allergies   ROS  As noted in HPI.   Physical Exam  BP 134/87 (BP Location: Left Arm)   Pulse 85   Temp 97.9 F (36.6 C) (Oral)   Resp 16   LMP 11/23/2023   SpO2 96%   Constitutional: Well developed, well nourished, no acute distress.  Speaking in full sentences. Eyes: PERRL, EOMI, conjunctiva normal bilaterally HENT: Normocephalic, atraumatic,mucus membranes moist.  Mild nasal congestion.  Normal turbinates.  No maxillary, frontal sinus tenderness.  No postnasal drip. Respiratory: Poor to fair air movement, diffuse expiratory wheezing, prolonged expiratory phase.  No rales or rhonchi.  No anterior, lateral chest wall tenderness Cardiovascular: Normal rate and rhythm, no murmurs, no gallops, no rubs GI: nondistended skin: No rash, skin intact Musculoskeletal: Calves symmetric, nontender, no edema Neurologic: Alert & oriented x 3, CN III-XII grossly intact, no motor deficits, sensation grossly intact Psychiatric: Speech and behavior appropriate   ED Course   Medications  albuterol (PROVENTIL) (2.5 MG/3ML) 0.083% nebulizer solution 2.5 mg (2.5 mg Nebulization Given 12/07/23 0940)  ipratropium-albuterol (DUONEB) 0.5-2.5 (3) MG/3ML nebulizer solution 3 mL (3 mLs Nebulization Given 12/07/23 0940)  dexamethasone (DECADRON) injection 10 mg (10 mg Intramuscular Given 12/07/23 0944)    Orders Placed This Encounter  Procedures   DG Chest 2 View    Standing Status:   Standing    Number of Occurrences:   1    Reason for Exam (SYMPTOM  OR DIAGNOSIS REQUIRED):   Cough, wheeze, shortness of breath for 1.5 weeks.  History of asthma.  Rule out pneumonia, pleural effusion, pulmonary edema   ED EKG    Standing Status:   Standing    Number of  Occurrences:   1    Reason for Exam:   Shortness of breath   EKG 12-Lead    Standing Status:   Standing    Number of Occurrences:   1   No results found for this or any previous visit (from the past 24 hours). DG Chest 2 View Result Date: 12/07/2023 CLINICAL DATA:  Cough, wheezing, shortness of breath EXAM: CHEST - 2 VIEW COMPARISON:  05/24/2023 FINDINGS: The heart size and mediastinal contours are within normal limits. Both lungs are clear. The visualized skeletal structures are unremarkable. IMPRESSION: No active cardiopulmonary disease. Electronically Signed   By: Charlett Nose M.D.   On: 12/07/2023 09:40    ED Clinical Impression  1. Acute cough   2. Moderate persistent asthma with exacerbation   3. Community acquired pneumonia  of right lower lobe of lung      ED Assessment/Plan     Outside records reviewed.  As noted in HPI.  Patient PERC negative.  Doubt PE.  Doubt CHF.  Suspect moderate asthma exacerbation.  Will check EKG, chest x-ray.  Will give DuoNeb 5/0.5 mg and reevaluate.  She is already on methylprednisolone, however, we will give dexamethasone 10 mg IM x 1.  EKG: Normal sinus rhythm, rate 78, normal axis, normal intervals, no hypertrophy, no ST-T wave changes.  Change compared to EKG from 05/2023.  Patient symptomatic while EKG was obtained.  HEART score:   History: Slightly suspicious 0 EKG: Normal 0 Age: 65-60 4+1 Risk factors: BMI above 30+1 Troponin: Not available  Total: 2.  Patient is at low risk of MACE at 0.9 to 1.7% in the next 30 days.  Discussed this with patient.  Reviewed imaging independently.  No acute cardiopulmonary disease per radiology, but I see some streakiness in the right lower lobe. See radiology report for full details.  Reevaluation, patient states that she is feeling much better.  Continued wheezing, but louder.  Much improved air movement.  Patient presents with a moderate asthma exacerbation and I am concerned for a right lower lobe  pneumonia.  Will add on Augmentin to the azithromycin.  Finish the azithromycin.  Regularly scheduled albuterol inhaler with a spacer for 4 days, then as needed thereafter.  Will switch from methylprednisolone to prednisone 40 mg for 5 days.  Advised her to stop the methylprednisolone.  She is to start the prednisone tomorrow.  Promethazine DM for cough.  She is to go to the ER if she does not get better or if she gets worse.  Discussed imaging, MDM, treatment plan, and plan for follow-up with patient Discussed sn/sx that should prompt return to the ED. patient agrees with plan.   Meds ordered this encounter  Medications   albuterol (PROVENTIL) (2.5 MG/3ML) 0.083% nebulizer solution 2.5 mg   ipratropium-albuterol (DUONEB) 0.5-2.5 (3) MG/3ML nebulizer solution 3 mL   dexamethasone (DECADRON) injection 10 mg   albuterol (VENTOLIN HFA) 108 (90 Base) MCG/ACT inhaler    Sig: Inhale 2 puffs into the lungs every 4 (four) hours as needed for wheezing or shortness of breath.    Dispense:  1 each    Refill:  0   Spacer/Aero-Holding Chambers (AEROCHAMBER MV) inhaler    Sig: Use as instructed    Dispense:  1 each    Refill:  1   amoxicillin-clavulanate (AUGMENTIN) 875-125 MG tablet    Sig: Take 1 tablet by mouth every 12 (twelve) hours for 5 days.    Dispense:  10 tablet    Refill:  0   promethazine-dextromethorphan (PROMETHAZINE-DM) 6.25-15 MG/5ML syrup    Sig: Take 5 mLs by mouth 4 (four) times daily as needed for cough.    Dispense:  118 mL    Refill:  0   predniSONE (DELTASONE) 20 MG tablet    Sig: Take 2 tablets (40 mg total) by mouth daily with breakfast for 5 days.    Dispense:  10 tablet    Refill:  0      *This clinic note was created using Scientist, clinical (histocompatibility and immunogenetics). Therefore, there may be occasional mistakes despite careful proofreading. ?    Domenick Gong, MD 12/07/23 [phone]

## 2023-12-07 NOTE — Discharge Instructions (Signed)
 You have a moderate asthma exacerbation and I am concerned for a right lower lobe pneumonia.  Adding on Augmentin to the azithromycin.  Finish the azithromycin.   Will switch from methylprednisolone to prednisone 40 mg for 5 days.  stop the methylprednisolone.  start the prednisone tomorrow.  Promethazine DM for cough.   go to the ER if she does not get better or if  you get get worse.  Take two puffs from your albuterol inhaler with your spacer every 4 hours for 2 days, then every 6 hours for 2 days, then as needed. You can back off if you start to improve  sooner.   If the spacer is too expensive at the pharmacy, you can get an AeroChamber Z-Stat off of [employer] for about $10-$15.  Go to www.goodrx.com  or www.costplusdrugs.com to look up your medications. This will give you a list of where you can find your prescriptions at the most affordable prices. Or ask the pharmacist what the cash price is, or if they have any other discount programs available to help make your medication more affordable. This can be less expensive than what you would pay with insurance.

## 2023-12-07 NOTE — ED Triage Notes (Signed)
 Pt c/o cough, wheezing and SOB x 4 days. Pt had an e-visit 2 days ago and was prescribed azithromycin, prednisone and an inhaler.

## 2024-06-07 ENCOUNTER — Ambulatory Visit
Admission: EM | Admit: 2024-06-07 | Discharge: 2024-06-07 | Disposition: A | Discharge status: Home / Self Care | Attending: Family Medicine | Admitting: Family Medicine

## 2024-06-07 DIAGNOSIS — J4521 Mild intermittent asthma with (acute) exacerbation: Secondary | ICD-10-CM

## 2024-06-07 DIAGNOSIS — J22 Unspecified acute lower respiratory infection: Secondary | ICD-10-CM | POA: Diagnosis not present

## 2024-06-07 MED ORDER — AZITHROMYCIN 250 MG PO TABS: ORAL_TABLET | ORAL | 0 refills | Status: AC

## 2024-06-07 MED ORDER — PREDNISONE 50 MG PO TABS: 50.0000 mg | ORAL_TABLET | Freq: Every day | ORAL | 0 refills | Status: AC

## 2024-06-07 MED ORDER — PROMETHAZINE-DM 6.25-15 MG/5ML PO SYRP: 5.0000 mL | ORAL_SOLUTION | Freq: Four times a day (QID) | ORAL | 0 refills | Status: AC | PRN

## 2024-06-07 NOTE — ED Triage Notes (Signed)
 Pt c/o cough,wheezing,drainage x1 wk. Had E-visit on Tuesday for the same issue & was given pred,zyrtec & inhaler w/minor relief. Hx of asthma.

## 2024-06-07 NOTE — Discharge Instructions (Signed)
 Take you remaining 3 tablets of prednisone  today. Start the 50 mg tablets daily.  Take the antibiotics as prescribed.  Use you albuterol  inhaler every 4 hours for the next 48 hours while awake and right before bed.    Stop by the pharmacy to pick up your prescriptions.  Follow up with your primary care provider or return to the urgent care, if not improving.

## 2024-06-07 NOTE — ED Provider Notes (Signed)
 [facility]    CSN: [id number] Arrival date & time: 06/07/24  9057      History   Chief Complaint Chief Complaint  Patient presents with   Cough   Wheezing    HPI Liliahna Cudd is a 48 y.o. female.   HPI  History obtained from the patient. Marnisha presents for 1 week of productive cough, wheezing with trouble breathing. She did a virtual appointment and was prescribed Zyrtec, albuterol  inhaler and 20 mg prednisone  daily which isn't helping. No fever, vomiting, diarrhea, and body aches.  Endorses headache, sore throat and ear pin but these resolved.  Has nasal congestion.         Past Medical History:  Diagnosis Date   Anemia    in her 66's   Anxiety    GERD (gastroesophageal reflux disease)    with pregnancy   Mild asthma    well controlled    Patient Active Problem List   Diagnosis Date Noted   Bimalleolar ankle fracture, right, closed, initial encounter 07/15/2018   Vitamin D  deficiency 05/06/2017   Obesity (BMI 30-39.9) 12/31/2015   ASCUS favor benign 08/21/2015    Past Surgical History:  Procedure Laterality Date   ORIF ANKLE FRACTURE Right 07/15/2018   Procedure: OPEN REDUCTION INTERNAL FIXATION (ORIF) ANKLE FRACTURE;  Surgeon: Marchia Drivers, MD;  Location: [facility];  Service: Orthopedics;  Laterality: Right;   TONSILLECTOMY     age 36     OB History     Gravida  4   Para  2   Term  2   Preterm      AB      Living  2      SAB      IAB      Ectopic      Multiple  0   Live Births  2            Home Medications    Prior to Admission medications   Medication Sig Start Date End Date Taking? Authorizing Provider  cetirizine (ZYRTEC) 10 MG tablet Take 10 mg by mouth daily as needed. 06/05/24  Yes [provider]  predniSONE  (DELTASONE ) 50 MG tablet Take 1 tablet (50 mg total) by mouth daily for 5 days. 06/07/24 06/12/24 Yes Safaa Stingley, DO  albuterol  (VENTOLIN  HFA) 108 (90 Base) MCG/ACT inhaler  INHALE 1-2 PUFFS INTO THE LUNGS EVERY 6 (SIX) HOURS AS NEEDED FOR WHEEZING OR SHORTNESS OF BREATH. 12/30/20   Lawhorn, Mavis Browning, CNM  albuterol  (VENTOLIN  HFA) 108 (90 Base) MCG/ACT inhaler Inhale 2 puffs into the lungs every 4 (four) hours as needed for wheezing or shortness of breath. 12/07/23   Van Knee, MD  azithromycin (ZITHROMAX) 250 MG tablet Take 2 tablets on day 1 then 1 tablet daily 06/07/24   Kriste Berth, DO  cholecalciferol (VITAMIN D3) 25 MCG (1000 UNIT) tablet Take 2,000 Units by mouth 2 (two) times daily.    [provider]  Prenatal Vit-Fe Fumarate-FA (PRENATAL VITAMINS) 28-0.8 MG TABS Take 28 mg by mouth daily. 01/12/20   Lawhorn, Mavis Browning, CNM  promethazine -dextromethorphan (PROMETHAZINE -DM) 6.25-15 MG/5ML syrup Take 5 mLs by mouth 4 (four) times daily as needed for cough. 06/07/24   Khyron Garno, DO  sertraline  (ZOLOFT ) 25 MG tablet TAKE 1 TABLET (25 MG TOTAL) BY MOUTH DAILY. Patient not taking: Reported on 01/14/2023 03/18/21   Laurier Mavis Browning, CNM  Spacer/Aero-Holding Chambers (AEROCHAMBER MV) inhaler Use as instructed 12/07/23   Van Knee, MD  sucralfate  (CARAFATE ) 1 GM/10ML suspension Take 10 mLs (1 g total) by mouth 4 (four) times daily -  with meals and at bedtime. 01/16/23   CardamaRaynell Moder, MD    Family History Family History  Problem Relation Age of Onset   Diabetes Mother    Kidney cancer Mother    Heart disease Father    Cancer Neg Hx    Breast cancer Neg Hx     Social History Social History   Tobacco Use   Smoking status: Former    Current packs/day: 0.00    Average packs/day: 1 pack/day for 10.0 years (10.0 ttl pk-yrs)    Types: Cigarettes    Start date: 07/14/1995    Quit date: 07/13/2005    Years since quitting: 18.9   Smokeless tobacco: Never  Vaping Use   Vaping status: Never Used  Substance Use Topics   Alcohol use: Not Currently    Comment: occ wine   Drug use: No     Allergies    Patient has no known allergies.   Review of Systems Review of Systems: negative unless otherwise stated in HPI.      Physical Exam Triage Vital Signs ED Triage Vitals  Encounter Vitals Group     BP 06/07/24 1031 (!) 135/97     Girls Systolic BP Percentile --      Girls Diastolic BP Percentile --      Boys Systolic BP Percentile --      Boys Diastolic BP Percentile --      Pulse Rate 06/07/24 1031 75     Resp 06/07/24 1031 16     Temp 06/07/24 1031 98.4 F (36.9 C)     Temp Source 06/07/24 1031 Oral     SpO2 06/07/24 1031 98 %     Weight 06/07/24 1029 220 lb (99.8 kg)     Height 06/07/24 1029 5' 3 (1.6 m)     Head Circumference --      Peak Flow --      Pain Score 06/07/24 1036 0     Pain Loc --      Pain Education --      Exclude from Growth Chart --    No data found.  Updated Vital Signs BP (!) 135/97 (BP Location: Left Wrist)   Pulse 75   Temp 98.4 F (36.9 C) (Oral)   Resp 16   Ht 5' 3 (1.6 m)   Wt 99.8 kg   LMP 05/24/2024 (Approximate)   SpO2 98%   BMI 38.97 kg/m   Visual Acuity Right Eye Distance:   Left Eye Distance:   Bilateral Distance:    Right Eye Near:   Left Eye Near:    Bilateral Near:     Physical Exam GEN:     alert, non-toxic appearing female in no distress    HENT:  mucus membranes moist, no nasal discharge EYES:   no scleral injection or discharge NECK:  normal ROM, no meningismus   RESP:  no increased work of breathing, expiratory wheezing bilaterally, no rales or rhonchi CVS:   regular rate and rhythm Skin:   warm and dry, no rash on visible skin    UC Treatments / Results  Labs (all labs ordered are listed, but only abnormal results are displayed) Labs Reviewed - No data to display  EKG   Radiology No results found.   Procedures Procedures (including critical care time)  Medications Ordered in UC Medications - No data to  display  Initial Impression / Assessment and Plan / UC Course  I have reviewed the  triage vital signs and the nursing notes.  Pertinent labs & imaging results that were available during my care of the patient were reviewed by me and considered in my medical decision making (see chart for details).       Pt is a 48 y.o. female who has history of asthma presents for 1 week of cough that is not improving.  Genene is  afebrile here without recent antipyretics. Satting well on room air. Overall pt is  non-toxic appearing, well hydrated, without respiratory distress. Pulmonary exam is remarkable for expiratory wheezing with cough.  After shared decision making, we will not pursue chest x-ray at this time.  COVID  and influenza testing deferred due to length of symptoms.   Treat acute asthma exacerbation with lower respiratory tract infection with steroids and antibiotics as below.  Promethazine  DM cough syrup given for cough and allow patient to rest.  Typical duration of symptoms discussed. Return and ED precautions given and patient voiced understanding.   Discussed MDM, treatment plan and plan for follow-up with patient who agrees with plan.     Final Clinical Impressions(s) / UC Diagnoses   Final diagnoses:  Lower respiratory tract infection  Mild intermittent asthma with acute exacerbation     Discharge Instructions      Take you remaining 3 tablets of prednisone  today. Start the 50 mg tablets daily.  Take the antibiotics as prescribed.  Use you albuterol  inhaler every 4 hours for the next 48 hours while awake and right before bed.    Stop by the pharmacy to pick up your prescriptions.  Follow up with your primary care provider or return to the urgent care, if not improving.       ED Prescriptions     Medication Sig Dispense Auth. Provider   azithromycin (ZITHROMAX) 250 MG tablet Take 2 tablets on day 1 then 1 tablet daily 6 each Cherly Erno, DO   predniSONE  (DELTASONE ) 50 MG tablet Take 1 tablet (50 mg total) by mouth daily for 5 days. 5 tablet Saurabh Hettich,  Rhett Mutschler, DO   promethazine -dextromethorphan (PROMETHAZINE -DM) 6.25-15 MG/5ML syrup Take 5 mLs by mouth 4 (four) times daily as needed for cough. 118 mL Lizabeth Fellner, DO      PDMP not reviewed this encounter.   Galo Sayed, DO 06/07/24 1519
